# Patient Record
Sex: Female | Born: 1955 | Race: White | Hispanic: No | Marital: Married | Smoking: Former smoker
Health system: Southern US, Community
[De-identification: ages and names within clinical notes are randomized; demographics above are authoritative.]

## PROBLEM LIST (undated history)

## (undated) DIAGNOSIS — E039 Hypothyroidism, unspecified: Secondary | ICD-10-CM

## (undated) DIAGNOSIS — E162 Hypoglycemia, unspecified: Secondary | ICD-10-CM

## (undated) DIAGNOSIS — T4145XA Adverse effect of unspecified anesthetic, initial encounter: Secondary | ICD-10-CM

## (undated) DIAGNOSIS — IMO0001 Reserved for inherently not codable concepts without codable children: Secondary | ICD-10-CM

## (undated) DIAGNOSIS — R87619 Unspecified abnormal cytological findings in specimens from cervix uteri: Secondary | ICD-10-CM

## (undated) DIAGNOSIS — Z5189 Encounter for other specified aftercare: Secondary | ICD-10-CM

## (undated) DIAGNOSIS — R51 Headache: Secondary | ICD-10-CM

## (undated) DIAGNOSIS — M810 Age-related osteoporosis without current pathological fracture: Secondary | ICD-10-CM

## (undated) DIAGNOSIS — M1990 Unspecified osteoarthritis, unspecified site: Secondary | ICD-10-CM

## (undated) DIAGNOSIS — K759 Inflammatory liver disease, unspecified: Secondary | ICD-10-CM

## (undated) DIAGNOSIS — C801 Malignant (primary) neoplasm, unspecified: Secondary | ICD-10-CM

## (undated) DIAGNOSIS — T8859XA Other complications of anesthesia, initial encounter: Secondary | ICD-10-CM

## (undated) HISTORY — DX: Age-related osteoporosis without current pathological fracture: M81.0

## (undated) HISTORY — PX: TONSILLECTOMY: SUR1361

## (undated) HISTORY — DX: Unspecified abnormal cytological findings in specimens from cervix uteri: R87.619

## (undated) HISTORY — PX: JOINT REPLACEMENT: SHX530

## (undated) HISTORY — PX: COLONOSCOPY: SHX174

## (undated) HISTORY — PX: CERVICAL CONE BIOPSY: SUR198

## (undated) HISTORY — PX: EYE SURGERY: SHX253

## (undated) HISTORY — DX: Hypoglycemia, unspecified: E16.2

## (undated) HISTORY — DX: Encounter for other specified aftercare: Z51.89

## (undated) HISTORY — DX: Malignant (primary) neoplasm, unspecified: C80.1

## (undated) HISTORY — PX: OTHER SURGICAL HISTORY: SHX169

---

## 2011-10-15 ENCOUNTER — Encounter (HOSPITAL_COMMUNITY): Payer: Self-pay | Admitting: Pharmacy Technician

## 2011-10-23 ENCOUNTER — Encounter (HOSPITAL_COMMUNITY)
Admission: RE | Admit: 2011-10-23 | Discharge: 2011-10-23 | Disposition: A | Discharge status: Home / Self Care | Payer: Managed Care, Other (non HMO) | Source: Ambulatory Visit | Attending: Orthopedic Surgery | Admitting: Orthopedic Surgery

## 2011-10-23 ENCOUNTER — Encounter (HOSPITAL_COMMUNITY): Payer: Self-pay

## 2011-10-23 HISTORY — DX: Hypothyroidism, unspecified: E03.9

## 2011-10-23 HISTORY — DX: Inflammatory liver disease, unspecified: K75.9

## 2011-10-23 HISTORY — DX: Headache: R51

## 2011-10-23 HISTORY — DX: Adverse effect of unspecified anesthetic, initial encounter: T41.45XA

## 2011-10-23 HISTORY — DX: Unspecified osteoarthritis, unspecified site: M19.90

## 2011-10-23 HISTORY — DX: Other complications of anesthesia, initial encounter: T88.59XA

## 2011-10-23 HISTORY — DX: Encounter for other specified aftercare: Z51.89

## 2011-10-23 HISTORY — DX: Reserved for inherently not codable concepts without codable children: IMO0001

## 2011-10-23 LAB — COMPREHENSIVE METABOLIC PANEL
ALT: 46 U/L — ABNORMAL HIGH (ref 0–35)
Albumin: 4 g/dL (ref 3.5–5.2)
Alkaline Phosphatase: 81 U/L (ref 39–117)
Chloride: 102 mEq/L (ref 96–112)
Potassium: 4.3 mEq/L (ref 3.5–5.1)
Sodium: 139 mEq/L (ref 135–145)
Total Bilirubin: 0.4 mg/dL (ref 0.3–1.2)
Total Protein: 8 g/dL (ref 6.0–8.3)

## 2011-10-23 LAB — CBC
Hemoglobin: 15 g/dL (ref 12.0–15.0)
MCHC: 33.9 g/dL (ref 30.0–36.0)
RDW: 12.7 % (ref 11.5–15.5)
WBC: 6.8 10*3/uL (ref 4.0–10.5)

## 2011-10-23 LAB — PROTIME-INR
INR: 1.02 (ref 0.00–1.49)
Prothrombin Time: 13.6 seconds (ref 11.6–15.2)

## 2011-10-23 MED ORDER — CHLORHEXIDINE GLUCONATE 4 % EX LIQD: 60.0000 mL | Freq: Once | CUTANEOUS | Status: DC

## 2011-10-23 NOTE — H&P (Signed)
[name] is an 56 y.o. female.   Chief Complaint: Pain in left hip.  HPI: Progressive L. Hip pain secondary to OA.  No past medical history on file.  No past surgical history on file.  No family history on file. Social History:  does not have a smoking history on file. She does not have any smokeless tobacco history on file. Her alcohol and drug histories not on file.  Allergies: No Known Allergies  No current facility-administered medications on file as of .   No current outpatient prescriptions on file as of .    No results found for this or any previous visit (from the past 48 hour(s)). No results found.  Review of Systems  Constitutional: Negative.   HENT: Negative.   Eyes: Negative.   Respiratory: Negative.   Cardiovascular: Negative.   Gastrointestinal: Negative.   Genitourinary: Negative.   Musculoskeletal: Positive for joint pain.  Skin: Negative.   Neurological: Negative.   Endo/Heme/Allergies: Negative.   Psychiatric/Behavioral: Negative.     There were no vitals taken for this visit. Physical Exam  Constitutional: She appears well-nourished.  HENT:  Head: Normocephalic.  Eyes: Conjunctivae are normal. Pupils are equal, round, and reactive to light.  Neck: Normal range of motion. Neck supple.  Cardiovascular: Normal rate and regular rhythm.   Respiratory: Effort normal and breath sounds normal.  GI: Soft.  Musculoskeletal: She exhibits tenderness.  Skin: Skin is warm.  Psychiatric: She has a normal mood and affect.     Assessment/Plan Left Total Hip Replacement  [name] III,Kenyon Eshleman L 10/23/2011, 9:03 AM

## 2011-10-23 NOTE — Patient Instructions (Signed)
20 Kayna Suppa  10/23/2011   Your procedure is scheduled on:  10/30/11   Tuesday   Surgery 1230-1530  Report to [facility] at 1000 AM.  Call this number if you have problems the morning of surgery: [phone]  PST Deliah Goody [id number]   Remember:   Do not eat food:After Midnight. Monday night  May have clear liquids:until Midnight . Monday night  Clear liquids include soda, tea, black coffee, apple or grape juice, broth.  Take these medicines the morning of surgery with A SIP OF WATER: Levothyroid with sip water  Do not wear jewelry, make-up or nail polish.  Do not wear lotions, powders, or perfumes. You may wear deodorant.  Do not shave 48 hours prior to surgery.  Do not bring valuables to the hospital.  Contacts, dentures or bridgework may not be worn into surgery.  Leave suitcase in the car. After surgery it may be brought to your room.  For patients admitted to the hospital, checkout time is 11:00 AM the day of discharge.   Patients discharged the day of surgery will not be allowed to drive home.  Name and phone number of your driver: Rehab  Special Instructions: CHG Shower Use Special Wash: 1/2 bottle night before surgery and 1/2 bottle morning of surgery. Regular soap face and privates   Please read over the following fact sheets that you were given: MRSA Information

## 2011-10-24 NOTE — Pre-Procedure Instructions (Signed)
STATES DOES NOT INGEST ANY SUGAR AND HASNT FOR YEARS_ USES STEVIA

## 2011-10-24 NOTE — Pre-Procedure Instructions (Signed)
PST visit 10/23/11- no criteria met for EKG/CHEST X RAY PER ANESTHESIA

## 2011-10-24 NOTE — H&P (Signed)
[name], [name] NO.:  [id number]  MEDICAL RECORD NO.:  [id number]  LOCATION:                               FACILITY:  [facility]  PHYSICIAN:  Marlowe Kays, M.D.  DATE OF BIRTH:  Jan 24, 1956  DATE OF ADMISSION:  10/30/2011 DATE OF DISCHARGE:                             HISTORY & PHYSICAL   Scheduled for admission to [facility] on October 30, 2011.  CHIEF COMPLAINT:  "Pain in my left hip."  HISTORY OF PRESENT ILLNESS:  This 56 year old lady has been seen by [name], continued progressive problems concerning pain into her left hip.  She has developed osteoarthritis into the hip and has locking, catching, and popping into the left hip area with groin pain.  She has had more more difficulty in ambulation.  She is relatively young lady and this is markedly interfering with her day-to-day activities.  After much consideration including the risks and benefits of surgery, she decided to go ahead with total hip replacement, arthroplasty on the left.  The ceramic system will be used.  ALLERGIES:  The patient has some mild allergy to Neosporin and eggs.  CURRENT MEDICATIONS:  Levothyroxine, sodium, probiotic, and multivitamins.  MEDICAL PROBLEMS:  Hypothyroidism, history of hepatitis C, mild hypoglycemia, childhood scarlet, and strep infections.  PAST SURGICAL HISTORY:  No previous surgeries.  SOCIAL HISTORY:  The patient is married.  She is a Dispensing optician, has smokes about 5 cigarettes per day, has no intake of alcohol products.  FAMILY HISTORY:  Unknown as she is adopted.  REVIEW OF SYSTEMS:  CNS:  No seizure disorder, paralysis, numbness, double vision.  RESPIRATORY:  No productive cough.  No hemoptysis.  No shortness of breath.  CARDIOVASCULAR:  No chest pain.  No angina.  No orthopnea.  GASTROINTESTINAL:  No nausea, vomiting, melena, or bloody stool.  GENITOURINARY:  No discharge, dysuria, or hematuria. MUSCULOSKELETAL:  Primarily in  present illness.  PHYSICAL EXAMINATION:  GENERAL:  She is alert, cooperative, friendly, 56- year-old, white female.  She is awake and alert.  She walks QAMARKER] painful limp and gait with a lurch to the left.  She is accompanied by her husband. HEENT:  Normocephalic.  PERRLA.  Oropharynx is clear. CHEST:  Clear to auscultation.  No rhonchi or rales. HEART:  Regular rate and rhythm.  No murmurs are heard. ABDOMEN:  Soft and nontender.  Liver and spleen not felt. GENITALIA/RECTAL/PELVIC/BREASTS:  Not done, not pertinent to present illness. EXTREMITIES:  She has pain with range of motion of the left hip, most particularly on internal and external rotation.  ADMITTING DIAGNOSES: 1. Osteoarthritis of the left hip. 2. Hypothyroidism. 3. History of hepatitis C.  PLAN:  The patient will undergo total hip replacement, arthroplasty with ceramic system to the left hip.  We will decide in the hospital whether she needs any inpatient rehabilitation after regular hospitalization.     Jamion Carter L. Cherlynn June.   ______________________________ Marlowe Kays, M.D.    DLU/MEDQ  D:  10/23/2011  T:  10/24/2011  Job:  [id number]  cc:   Marlowe Kays, M.D. Fax: [phone]

## 2011-10-30 ENCOUNTER — Inpatient Hospital Stay (HOSPITAL_COMMUNITY): Payer: Managed Care, Other (non HMO)

## 2011-10-30 ENCOUNTER — Inpatient Hospital Stay (HOSPITAL_COMMUNITY): Payer: Managed Care, Other (non HMO) | Admitting: Certified Registered Nurse Anesthetist

## 2011-10-30 ENCOUNTER — Encounter (HOSPITAL_COMMUNITY): Payer: Self-pay | Admitting: Certified Registered Nurse Anesthetist

## 2011-10-30 ENCOUNTER — Encounter (HOSPITAL_COMMUNITY)
Admission: RE | Disposition: A | Discharge status: Home Health | Payer: Self-pay | Source: Ambulatory Visit | Attending: Orthopedic Surgery

## 2011-10-30 ENCOUNTER — Encounter (HOSPITAL_COMMUNITY): Payer: Self-pay | Admitting: *Deleted

## 2011-10-30 ENCOUNTER — Inpatient Hospital Stay (HOSPITAL_COMMUNITY)
Admission: RE | Admit: 2011-10-30 | Discharge: 2011-11-03 | DRG: 470 | Disposition: A | Discharge status: Home Health | Payer: Managed Care, Other (non HMO) | Source: Ambulatory Visit | Attending: Orthopedic Surgery | Admitting: Orthopedic Surgery

## 2011-10-30 DIAGNOSIS — M169 Osteoarthritis of hip, unspecified: Principal | ICD-10-CM | POA: Diagnosis present

## 2011-10-30 DIAGNOSIS — D649 Anemia, unspecified: Secondary | ICD-10-CM | POA: Diagnosis not present

## 2011-10-30 DIAGNOSIS — Z96649 Presence of unspecified artificial hip joint: Secondary | ICD-10-CM

## 2011-10-30 DIAGNOSIS — M1610 Unilateral primary osteoarthritis, unspecified hip: Principal | ICD-10-CM | POA: Diagnosis present

## 2011-10-30 DIAGNOSIS — F17200 Nicotine dependence, unspecified, uncomplicated: Secondary | ICD-10-CM | POA: Diagnosis present

## 2011-10-30 DIAGNOSIS — Z8619 Personal history of other infectious and parasitic diseases: Secondary | ICD-10-CM

## 2011-10-30 DIAGNOSIS — E039 Hypothyroidism, unspecified: Secondary | ICD-10-CM | POA: Diagnosis present

## 2011-10-30 DIAGNOSIS — Z01812 Encounter for preprocedural laboratory examination: Secondary | ICD-10-CM

## 2011-10-30 HISTORY — PX: TOTAL HIP ARTHROPLASTY: SHX124

## 2011-10-30 LAB — TYPE AND SCREEN
ABO/RH(D): A POS
Antibody Screen: NEGATIVE

## 2011-10-30 SURGERY — ARTHROPLASTY, HIP, TOTAL,POSTERIOR APPROACH
Anesthesia: General | Site: Hip | Laterality: Left | Wound class: Clean

## 2011-10-30 MED ORDER — CEFAZOLIN SODIUM 1-5 GM-% IV SOLN
1.0000 g | Freq: Four times a day (QID) | INTRAVENOUS | Status: AC
  Administered 2011-10-30 – 2011-10-31 (×3): 1 g via INTRAVENOUS
  Filled 2011-10-30 (×3): qty 50

## 2011-10-30 MED ORDER — HYDROMORPHONE 0.3 MG/ML IV SOLN
INTRAVENOUS | Status: DC
  Administered 2011-10-30: 17:00:00 via INTRAVENOUS
  Administered 2011-10-30: 0.6 mg via INTRAVENOUS
  Administered 2011-10-31: 1.5 mg via INTRAVENOUS
  Administered 2011-10-31: 1 mg via INTRAVENOUS
  Administered 2011-10-31: 1.1 mg via INTRAVENOUS
  Administered 2011-10-31: 0.9 mg via INTRAVENOUS
  Administered 2011-10-31 (×2): 0.6 mg via INTRAVENOUS
  Administered 2011-11-01: 0.3 mg via INTRAVENOUS
  Administered 2011-11-01 (×2): 0.6 mg via INTRAVENOUS

## 2011-10-30 MED ORDER — ONDANSETRON HCL 4 MG/2ML IJ SOLN
INTRAMUSCULAR | Status: DC | PRN
  Administered 2011-10-30: 4 mg via INTRAVENOUS

## 2011-10-30 MED ORDER — SODIUM CHLORIDE 0.9 % IR SOLN
Status: DC | PRN
  Administered 2011-10-30: 1000 mL

## 2011-10-30 MED ORDER — LACTATED RINGERS IV SOLN
INTRAVENOUS | Status: DC
  Administered 2011-10-30 (×2): via INTRAVENOUS
  Administered 2011-10-30: 1000 mL via INTRAVENOUS

## 2011-10-30 MED ORDER — METHOCARBAMOL 100 MG/ML IJ SOLN
500.0000 mg | Freq: Four times a day (QID) | INTRAVENOUS | Status: DC | PRN
  Administered 2011-10-30: 500 mg via INTRAVENOUS
  Filled 2011-10-30: qty 5

## 2011-10-30 MED ORDER — DIPHENHYDRAMINE HCL 50 MG/ML IJ SOLN: 12.5000 mg | Freq: Four times a day (QID) | INTRAMUSCULAR | Status: DC | PRN

## 2011-10-30 MED ORDER — METHOCARBAMOL 500 MG PO TABS
500.0000 mg | ORAL_TABLET | Freq: Four times a day (QID) | ORAL | Status: DC | PRN
  Administered 2011-11-01 – 2011-11-03 (×5): 500 mg via ORAL
  Filled 2011-10-30 (×5): qty 1

## 2011-10-30 MED ORDER — NALOXONE HCL 0.4 MG/ML IJ SOLN: 0.4000 mg | INTRAMUSCULAR | Status: DC | PRN

## 2011-10-30 MED ORDER — MIDAZOLAM HCL 5 MG/5ML IJ SOLN
INTRAMUSCULAR | Status: DC | PRN
  Administered 2011-10-30: 2 mg via INTRAVENOUS

## 2011-10-30 MED ORDER — HYDROMORPHONE HCL PF 1 MG/ML IJ SOLN
INTRAMUSCULAR | Status: DC | PRN
  Administered 2011-10-30 (×2): 1 mg via INTRAVENOUS
  Administered 2011-10-30: .4 mg via INTRAVENOUS

## 2011-10-30 MED ORDER — CISATRACURIUM BESYLATE 2 MG/ML IV SOLN
INTRAVENOUS | Status: DC | PRN
  Administered 2011-10-30: 6 mg via INTRAVENOUS
  Administered 2011-10-30: 5 mg via INTRAVENOUS

## 2011-10-30 MED ORDER — FENTANYL CITRATE 0.05 MG/ML IJ SOLN
INTRAMUSCULAR | Status: DC | PRN
  Administered 2011-10-30 (×5): 50 ug via INTRAVENOUS

## 2011-10-30 MED ORDER — MEPERIDINE HCL 50 MG/ML IJ SOLN: 6.2500 mg | INTRAMUSCULAR | Status: DC | PRN

## 2011-10-30 MED ORDER — ACETAMINOPHEN 650 MG RE SUPP: 650.0000 mg | Freq: Four times a day (QID) | RECTAL | Status: DC | PRN

## 2011-10-30 MED ORDER — DEXTROSE-NACL 5-0.45 % IV SOLN
INTRAVENOUS | Status: DC
  Administered 2011-10-30 – 2011-10-31 (×3): via INTRAVENOUS

## 2011-10-30 MED ORDER — DOCUSATE SODIUM 100 MG PO CAPS
100.0000 mg | ORAL_CAPSULE | Freq: Two times a day (BID) | ORAL | Status: DC
  Administered 2011-10-31 – 2011-11-03 (×6): 100 mg via ORAL
  Filled 2011-10-30 (×9): qty 1

## 2011-10-30 MED ORDER — RIVAROXABAN 10 MG PO TABS
10.0000 mg | ORAL_TABLET | Freq: Every day | ORAL | Status: DC
  Administered 2011-10-31 – 2011-11-03 (×4): 10 mg via ORAL
  Filled 2011-10-30 (×4): qty 1

## 2011-10-30 MED ORDER — POVIDONE-IODINE 7.5 % EX SOLN: Freq: Once | CUTANEOUS | Status: DC

## 2011-10-30 MED ORDER — ACETAMINOPHEN 325 MG PO TABS
650.0000 mg | ORAL_TABLET | Freq: Four times a day (QID) | ORAL | Status: DC | PRN
  Filled 2011-10-30 (×2): qty 2

## 2011-10-30 MED ORDER — SODIUM CHLORIDE 0.9 % IV SOLN: INTRAVENOUS | Status: DC

## 2011-10-30 MED ORDER — MENTHOL 3 MG MT LOZG: 1.0000 | LOZENGE | OROMUCOSAL | Status: DC | PRN

## 2011-10-30 MED ORDER — DIPHENHYDRAMINE HCL 12.5 MG/5ML PO ELIX: 12.5000 mg | ORAL_SOLUTION | Freq: Four times a day (QID) | ORAL | Status: DC | PRN

## 2011-10-30 MED ORDER — LEVOTHYROXINE SODIUM 75 MCG PO TABS
37.5000 ug | ORAL_TABLET | Freq: Every day | ORAL | Status: DC
  Administered 2011-10-31: 08:00:00 via ORAL
  Administered 2011-11-01 – 2011-11-03 (×3): 37.5 ug via ORAL
  Filled 2011-10-30 (×4): qty 0.5

## 2011-10-30 MED ORDER — PHENOL 1.4 % MT LIQD: 1.0000 | OROMUCOSAL | Status: DC | PRN

## 2011-10-30 MED ORDER — SUCCINYLCHOLINE CHLORIDE 20 MG/ML IJ SOLN
INTRAMUSCULAR | Status: DC | PRN
  Administered 2011-10-30: 100 mg via INTRAVENOUS

## 2011-10-30 MED ORDER — CEFAZOLIN SODIUM-DEXTROSE 2-3 GM-% IV SOLR
2.0000 g | INTRAVENOUS | Status: AC
  Administered 2011-10-30: 1 g via INTRAVENOUS

## 2011-10-30 MED ORDER — HYDROMORPHONE HCL PF 1 MG/ML IJ SOLN
0.2500 mg | INTRAMUSCULAR | Status: DC | PRN
  Administered 2011-10-30 (×4): 0.25 mg via INTRAVENOUS

## 2011-10-30 MED ORDER — PROMETHAZINE HCL 25 MG/ML IJ SOLN: 6.2500 mg | INTRAMUSCULAR | Status: DC | PRN

## 2011-10-30 MED ORDER — LIDOCAINE HCL (CARDIAC) 20 MG/ML IV SOLN
INTRAVENOUS | Status: DC | PRN
  Administered 2011-10-30: 60 mg via INTRAVENOUS

## 2011-10-30 MED ORDER — ONDANSETRON HCL 4 MG/2ML IJ SOLN: 4.0000 mg | Freq: Four times a day (QID) | INTRAMUSCULAR | Status: DC | PRN

## 2011-10-30 MED ORDER — PROPOFOL 10 MG/ML IV BOLUS
INTRAVENOUS | Status: DC | PRN
  Administered 2011-10-30: 200 mg via INTRAVENOUS

## 2011-10-30 MED ORDER — SODIUM CHLORIDE 0.9 % IJ SOLN: 9.0000 mL | INTRAMUSCULAR | Status: DC | PRN

## 2011-10-30 MED ORDER — NEOSTIGMINE METHYLSULFATE 1 MG/ML IJ SOLN
INTRAMUSCULAR | Status: DC | PRN
  Administered 2011-10-30: 4 mg via INTRAVENOUS

## 2011-10-30 MED ORDER — LACTATED RINGERS IV SOLN: INTRAVENOUS | Status: DC

## 2011-10-30 MED ORDER — CHLORHEXIDINE GLUCONATE 4 % EX LIQD: 60.0000 mL | Freq: Once | CUTANEOUS | Status: DC

## 2011-10-30 MED ORDER — GLYCOPYRROLATE 0.2 MG/ML IJ SOLN
INTRAMUSCULAR | Status: DC | PRN
  Administered 2011-10-30: .6 mg via INTRAVENOUS

## 2011-10-30 SURGICAL SUPPLY — 39 items
BAG ZIPLOCK 12X15 (MISCELLANEOUS) ×4 IMPLANT
BIT DRILL 2.4X128 (BIT) ×2 IMPLANT
BLADE SAW SAG 73X25 THK (BLADE) ×1
BLADE SAW SGTL 73X25 THK (BLADE) ×1 IMPLANT
CLOTH BEACON ORANGE TIMEOUT ST (SAFETY) ×2 IMPLANT
DRAPE INCISE IOBAN 66X45 STRL (DRAPES) ×2 IMPLANT
DRAPE ORTHO SPLIT 77X108 STRL (DRAPES) ×2
DRAPE POUCH INSTRU U-SHP 10X18 (DRAPES) ×2 IMPLANT
DRAPE SURG ORHT 6 SPLT 77X108 (DRAPES) ×2 IMPLANT
DRAPE U-SHAPE 47X51 STRL (DRAPES) ×2 IMPLANT
DRSG ADAPTIC 3X8 NADH LF (GAUZE/BANDAGES/DRESSINGS) ×2 IMPLANT
DRSG PAD ABDOMINAL 8X10 ST (GAUZE/BANDAGES/DRESSINGS) ×2 IMPLANT
DURAPREP 26ML APPLICATOR (WOUND CARE) ×2 IMPLANT
ELECT BLADE TIP CTD 4 INCH (ELECTRODE) ×2 IMPLANT
ELECT REM PT RETURN 9FT ADLT (ELECTROSURGICAL)
ELECTRODE REM PT RTRN 9FT ADLT (ELECTROSURGICAL) IMPLANT
EVACUATOR 1/8 PVC DRAIN (DRAIN) IMPLANT
FACESHIELD LNG OPTICON STERILE (SAFETY) ×8 IMPLANT
GAUZE SPONGE 4X4 12PLY STRL LF (GAUZE/BANDAGES/DRESSINGS) ×2 IMPLANT
GLOVE BIO SURGEON STRL SZ8 (GLOVE) ×2 IMPLANT
GLOVE ECLIPSE 8.0 STRL XLNG CF (GLOVE) ×4 IMPLANT
GLOVE INDICATOR 8.0 STRL GRN (GLOVE) ×4 IMPLANT
GOWN STRL REIN XL XLG (GOWN DISPOSABLE) ×4 IMPLANT
KIT BASIN OR (CUSTOM PROCEDURE TRAY) ×2 IMPLANT
MANIFOLD NEPTUNE II (INSTRUMENTS) ×2 IMPLANT
NS IRRIG 1000ML POUR BTL (IV SOLUTION) ×2 IMPLANT
PACK TOTAL JOINT (CUSTOM PROCEDURE TRAY) ×2 IMPLANT
PILLOW ABDUCTION HIP (SOFTGOODS) ×2 IMPLANT
POSITIONER SURGICAL ARM (MISCELLANEOUS) ×2 IMPLANT
SPONGE LAP 18X18 X RAY DECT (DISPOSABLE) ×2 IMPLANT
STAPLER VISISTAT 35W (STAPLE) ×2 IMPLANT
SUCTION FRAZIER TIP 10 FR DISP (SUCTIONS) ×2 IMPLANT
SUT VIC AB 1 CT1 27 (SUTURE) ×4
SUT VIC AB 1 CT1 27XBRD ANTBC (SUTURE) ×4 IMPLANT
SUT VIC AB 2-0 CT1 27 (SUTURE) ×2
SUT VIC AB 2-0 CT1 27XBRD (SUTURE) ×2 IMPLANT
TAPE CLOTH SURG 6X10 WHT LF (GAUZE/BANDAGES/DRESSINGS) ×2 IMPLANT
TRAY FOLEY CATH 14FRSI W/METER (CATHETERS) ×2 IMPLANT
WATER STERILE IRR 1500ML POUR (IV SOLUTION) ×2 IMPLANT

## 2011-10-30 NOTE — Anesthesia Postprocedure Evaluation (Signed)
  Anesthesia Post-op Note  Patient: [name]  Procedure(s) Performed:  TOTAL HIP ARTHROPLASTY  Patient Location: PACU  Anesthesia Type: General  Level of Consciousness: awake and alert   Airway and Oxygen Therapy: Patient Spontanous Breathing  Post-op Pain: mild  Post-op Assessment: Post-op Vital signs reviewed, Patient's Cardiovascular Status Stable, Respiratory Function Stable, Patent Airway and No signs of Nausea or vomiting  Post-op Vital Signs: stable  Complications: No apparent anesthesia complications

## 2011-10-30 NOTE — Transfer of Care (Signed)
Immediate Anesthesia Transfer of Care Note  Patient: [name]  Procedure(s) Performed:  TOTAL HIP ARTHROPLASTY  Patient Location: PACU  Anesthesia Type: General  Level of Consciousness: awake, alert  and oriented  Airway & Oxygen Therapy: Patient Spontanous Breathing and Patient connected to face mask oxygen  Post-op Assessment: Report given to PACU RN and Post -op Vital signs reviewed and stable  Post vital signs: Reviewed and stable  Complications: No apparent anesthesia complications

## 2011-10-30 NOTE — Interval H&P Note (Signed)
History and Physical Interval Note:  10/30/2011 1:26 PM  [name]  has presented today for surgery, with the diagnosis of Osteoarthritis of the Left hip  The various methods of treatment have been discussed with the patient and family. After consideration of risks, benefits and other options for treatment, the patient has consented to  Procedure(s): TOTAL HIP ARTHROPLASTY as a surgical intervention .  The patients' history has been reviewed, patient examined, no change in status, stable for surgery.  I have reviewed the patients' chart and labs.  Questions were answered to the patient's satisfaction.     APLINGTON,JAMES P

## 2011-10-30 NOTE — Anesthesia Procedure Notes (Signed)
Procedure Name: Intubation Date/Time: 10/30/2011 1:39 PM Performed by: Hulan Fess Pre-anesthesia Checklist: Patient identified, Emergency Drugs available, Suction available, Patient being monitored and Timeout performed Patient Re-evaluated:Patient Re-evaluated prior to inductionOxygen Delivery Method: Circle System Utilized Preoxygenation: Pre-oxygenation with 100% oxygen Intubation Type: IV induction Ventilation: Mask ventilation without difficulty Grade View: Grade I Tube type: Oral Tube size: 8.0 mm Number of attempts: 1 Secured at: 22 cm Tube secured with: Tape Dental Injury: Teeth and Oropharynx as per pre-operative assessment

## 2011-10-30 NOTE — Brief Op Note (Signed)
10/30/2011  4:03 PM  PATIENT:  [name]  56 y.o. female  PRE-OPERATIVE DIAGNOSIS:  Osteoarthritis of the Left hip  POST-OPERATIVE DIAGNOSIS:  Osteoarthritis of the Left hip  PROCEDURE:  Procedure(s): TOTAL HIP ARTHROPLASTY,left  SURGEON:  Surgeon(s): Drucilla Schmidt, MD Jacki Cones, MD  PHYSICIAN ASSISTANT:   ASSISTANTS: nurse  ANESTHESIA:   general  EBL:  Total I/O In: 2400 [I.V.:2400] Out: 975 [Urine:225; Blood:750]  BLOOD ADMINISTERED:none  DRAINS: none   LOCAL MEDICATIONS USED:  NONE  SPECIMEN:  No Specimen  DISPOSITION OF SPECIMEN:  N/A  COUNTS:  YES  TOURNIQUET:  * No tourniquets in log *  DICTATION: Reubin Milan Dictation    [phone]  PLAN OF CARE: Admit to inpatient   PATIENT DISPOSITION:  PACU - hemodynamically stable.

## 2011-10-30 NOTE — Anesthesia Preprocedure Evaluation (Signed)
Anesthesia Evaluation  Patient identified by MRN, date of birth, ID band Patient awake    Reviewed: Allergy & Precautions, H&P , NPO status , Patient's Chart, lab work & pertinent test results  History of Anesthesia Complications Negative for: history of anesthetic complications  Airway Mallampati: II TM Distance: >3 FB Neck ROM: Full    Dental No notable dental hx.    Pulmonary neg pulmonary ROS, Current Smoker,  clear to auscultation  Pulmonary exam normal       Cardiovascular neg cardio ROS Regular Normal    Neuro/Psych  Headaches, Negative Neurological ROS  Negative Psych ROS   GI/Hepatic negative GI ROS, Neg liver ROS, (+) Hepatitis -, C  Endo/Other  Negative Endocrine ROSHypothyroidism   Renal/GU negative Renal ROS  Genitourinary negative   Musculoskeletal negative musculoskeletal ROS (+)   Abdominal   Peds negative pediatric ROS (+)  Hematology negative hematology ROS (+)   Anesthesia Other Findings Front upper veneers   Reproductive/Obstetrics negative OB ROS                           Anesthesia Physical Anesthesia Plan  ASA: II  Anesthesia Plan: General   Post-op Pain Management:    Induction: Intravenous  Airway Management Planned: Oral ETT  Additional Equipment:   Intra-op Plan:   Post-operative Plan: Extubation in OR  Informed Consent: I have reviewed the patients History and Physical, chart, labs and discussed the procedure including the risks, benefits and alternatives for the proposed anesthesia with the patient or authorized representative who has indicated his/her understanding and acceptance.   Dental advisory given  Plan Discussed with: CRNA  Anesthesia Plan Comments:         Anesthesia Quick Evaluation

## 2011-10-31 LAB — CBC
MCH: 31.1 pg (ref 26.0–34.0)
MCV: 90.8 fL (ref 78.0–100.0)
Platelets: 255 10*3/uL (ref 150–400)
RBC: 3.57 MIL/uL — ABNORMAL LOW (ref 3.87–5.11)

## 2011-10-31 MED ORDER — HYDROMORPHONE 0.3 MG/ML IV SOLN
INTRAVENOUS | Status: AC
  Filled 2011-10-31: qty 25

## 2011-10-31 NOTE — Op Note (Signed)
NAMEJENSYN, SHAVE          ACCOUNT NO.:  [id number]  MEDICAL RECORD NO.:  [id number]  LOCATION:  1604                         FACILITY:  [facility]  PHYSICIAN:  Marlowe Kays, M.D.  DATE OF BIRTH:  1956/02/01  DATE OF PROCEDURE:  10/30/2011 DATE OF DISCHARGE:                              OPERATIVE REPORT   PREOPERATIVE DIAGNOSIS:  Osteoarthritis, left hip.  POSTOPERATIVE DIAGNOSIS:  Osteoarthritis, left hip.  OPERATION:  Osteonics total hip replacement, left.  SURGEON:  Marlowe Kays, M.D.  ASSISTANT:  Georges Lynch. Darrelyn Hillock, M.D.  ANESTHESIA:  General.  PATHOLOGY AND JUSTIFICATION FOR PROCEDURE:  She had a completely worn out left hip with significant pain.  There was bone-on-bone abutment between the femoral head and acetabulum.  Risks and complications were discussed with her.  She desired to have a ceramic hip.  PROCEDURE IN DETAIL:  Prophylactic antibiotics, satisfied general anesthesia, Foley catheter inserted, right lateral decubitus position, marked to frame, left hip was prepped with DuraPrep, draped in sterile field, IV employed, time-out performed, posterolateral incision down to the fascia lata.  External rotators were detached from the femur and Zickel's band__________ was cut.  Hip capsule was identified and opened with a partial capsulectomy performed.  The hip was dislocated and I made my first femoral neck cut, right below the femoral head.  Piriformis fossa was cleared of soft tissue and canal finder was then used to place down the femoral canal followed by vertical reamers up to a size 8.  I along the way made my final cut of the femoral neck.  I think fingerbreadths above the lesser trochanter.  I also used a greater trochanteric reamer. I then began the rasping process up to an 8, which seemed to be a nice fit.  Dressing the acetabulum, total capsulectomy was performed and then reaming begun, I used the deepening reamer 44 followed by  46 to eliminate  the fossa ovale and get down to good bleeding bone.  I checked acetabular bone bed at this point with a small drill and it was satisfactory and began expanding reamers working up to a 56 with good bone contact and good bleeding bone.  I then made a placed the trial 56 component, which was nice and snug and appeared to be in good position. Accordingly, I followed this with a 56 metal-backed acetabular shell, which we impacted completely and was nice and stable.  I then placed 2 screws one superior and one slightly posterior, which were individually measured and remained in bone with 25 mm screw.  Acetabulum was then felt to be nice and stable and returned to the femur where it was cleared of blood and the femoral component noncemented was placed, size 8, 127-degree angle.  This was impacted gently, but firmly and we then went about checking neck lengths and.  The question was between +5 and +10 neck length.  The +10 appeared to give too long a femur, checking knee length, and foot length, and consequently we used a size +5, which was very stable in flexion and rotation.  The appropriate +5 ceramic head was then impacted along with a 46 polyethylene shell and the hip was reduced and found to be nice and stable.  The wound was once again irrigated as we had throughout the case and closure performed.  External rotators and Zickel's band__________ were reconstituted with interrupted #1 Vicryl, fascia lata with the same, subcutaneous tissue with combination of #1 and 2-0 Vicryl, staples in the skin.  Betadine, Adaptic, dry sterile dressing were applied.  She was gently moved over to the PACU bed and placed in a abduction pillow.  She tolerated the procedure well.  Estimated blood loss was 750 cc with no blood replacement.          ______________________________ Marlowe Kays, M.D.     JA/MEDQ  D:  10/30/2011  T:  10/31/2011  Job:  [id number]

## 2011-10-31 NOTE — Evaluation (Addendum)
Physical Therapy Evaluation Patient Details Name: [name] MRN: [id number] DOB: Jul 06, 1956 Today's Date: 10/31/2011  Problem List: There is no problem list on file for this patient.  S/p posterior LEFT THA---50%PWB  Past Medical History:  Past Medical History  Diagnosis Date  . Complication of anesthesia     sensitive to all meds- takes longer to metabolize  . Hypothyroidism   . Blood transfusion     1968  . Arthritis   . 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 Hx of BLACK WIDOW SPIDER BITE x 2 left shoulder 10/11 with  "knot at base of scull"   seen by PCP- no neuro visit- resolved  . Hepatitis     ? year- HEPATITIS C   Past Surgical History:  Past Surgical History  Procedure Date  . Tonsillectomy   . Colonoscopy   . Eye surgery     Lasic- left eye    PT Assessment/Plan/Recommendation PT Assessment Clinical Impression Statement: pt will benefit from PT to maximize independence for next venue PT Recommendation/Assessment: Patient will need skilled PT in the acute care venue PT Problem List: Decreased strength;Decreased range of motion;Decreased activity tolerance;Decreased knowledge of use of DME;Decreased knowledge of precautions;Pain Barriers to Discharge: Decreased caregiver support (husband works days) PT Therapy Diagnosis : Difficulty walking PT Plan PT Frequency: 7X/week PT Treatment/Interventions: DME instruction;Gait training;Functional mobility training;Therapeutic activities;Therapeutic exercise;Patient/family education PT Recommendation Follow Up Recommendations: Home health PT;Skilled nursing facility (depending on progress) Equipment Recommended:  (TBA) PT Goals  Acute Rehab PT Goals PT Goal Formulation: With patient Time For Goal Achievement: 7 days Pt will go Supine/Side to Sit: with min assist PT Goal: Supine/Side to Sit - Progress: Goal set today Pt will go Sit to Supine/Side: with min assist PT Goal: Sit to Supine/Side -  Progress: Goal set today Pt will go Sit to Stand: with supervision PT Goal: Sit to Stand - Progress: Goal set today Pt will go Stand to Sit: with supervision PT Goal: Stand to Sit - Progress: Goal set today Pt will Ambulate: 51 - 150 feet;with supervision;with rolling walker PT Goal: Ambulate - Progress: Goal set today Pt will Go Up / Down Stairs: 6-9 stairs;with min assist;with least restrictive assistive device PT Goal: Up/Down Stairs - Progress: Goal set today Pt will Perform Home Exercise Program: with supervision, verbal cues required/provided PT Goal: Perform Home Exercise Program - Progress: Goal set today  PT Evaluation Precautions/Restrictions  Precautions Precautions: Posterior Hip Precaution Booklet Issued:  (sign in room) Prior Functioning  Home Living Lives With: Spouse Receives Help From:  (husband) Type of Home: House Home Layout: Two level;Able to live on main level with bedroom/bathroom Home Access: Stairs to enter Entrance Stairs-Rails: None Entrance Stairs-Number of Steps: 6 Prior Function Level of Independence: Independent with basic ADLs;Independent with gait Able to Take Stairs?: Yes Cognition   Sensation/Coordination   Extremity Assessment RUE Assessment RUE Assessment: Within Functional Limits LUE Assessment LUE Assessment: Within Functional Limits RLE Assessment RLE Assessment: Within Functional Limits LLE Assessment LLE Assessment:  (ankle WFL, AAROM hip/knee WFL, painful, > 2+/5) Mobility (including Balance) Bed Mobility Bed Mobility: Yes Supine to Sit: 3: Mod assist Supine to Sit Details (indicate cue type and reason): cues for technique Sitting - Scoot to Edge of Bed: 4: Min assist Sitting - Scoot to [place] of Bed Details (indicate cue type and reason): min assist with LLE Transfers Transfers: Yes Sit to Stand: 4: Min assist;3: Mod assist;With upper extremity assist;From bed;From  chair/3-in-1 Sit to Stand Details (indicate cue type and  reason): cues for THP Stand to Sit: 4: Min assist;3: Mod assist;To chair/3-in-1 Stand to Sit Details: cues for THP Ambulation/Gait Ambulation/Gait: Yes Ambulation/Gait Assistance: 4: Min assist;3: Mod assist Ambulation/Gait Assistance Details (indicate cue type and reason): cues for sequence, RW placement Ambulation Distance (Feet): 15 Feet (x2) Assistive device: Rolling walker Gait Pattern: Step-to pattern;Antalgic Gait velocity: slow    Exercise  Total Joint Exercises Ankle Circles/Pumps: AROM;Both;10 reps End of Session PT - End of Session Activity Tolerance: Patient limited by pain Patient left: in chair Nurse Communication: Mobility status for transfers;Mobility status for ambulation General Behavior During Session: [facility] for tasks performed Cognition: [facility] for tasks performed  [facility] 10/31/2011, 9:48 AM

## 2011-10-31 NOTE — Progress Notes (Signed)
Patient ID: [name], female   DOB: 06/01/1956, 56 y.o.   MRN: [id number] PO day 1--comfortable on PCA.  Post op xray looks good.  Hgb 11.1.  NV intact.

## 2011-10-31 NOTE — Progress Notes (Signed)
FL2 in shadow chart for MD signature. CSW assisting with d/c planning (  HHPT vs ST SNF ). Pt is interested in [facility] and [facility] ( [place] ) if SNF is required. SNF's contacted to check availability and if they are in network with insurance provider. Will continue to follow to assist with d/c planning.

## 2011-10-31 NOTE — Progress Notes (Signed)
Physical Therapy Treatment Patient Details Name: [name] MRN: [id number] DOB: 12/02/1955 Today's Date: 10/31/2011  PT Assessment/Plan  PT - Assessment/Plan Comments on Treatment Session: pt fatigued thsi pm, should progress well, motivated to go home vs SNF at this tiem PT Plan: Discharge plan remains appropriate;Frequency remains appropriate PT Frequency: 7X/week Follow Up Recommendations: Home health PT;Skilled nursing facility (likely home) Equipment Recommended: Rolling walker with 5" wheels (?) PT Goals  Acute Rehab PT Goals  Time For Goal Achievement: 7 days  Pt will go Sit to Supine/Side: with min assist PT Goal: Sit to Supine/Side - Progress: Progressing toward goal  Pt will go Sit to Stand: with supervision PT Goal: Sit to Stand - Progress: Progressing toward goal  Pt will go Stand to Sit: with supervision PT Goal: Stand to Sit - Progress: Progressing toward goal  Pt will Ambulate: 51 - 150 feet;with supervision;with rolling walker PT Goal: Ambulate - Progress: Progressing toward goal  Pt will Perform Home Exercise Program: with supervision, verbal cues required/provided PT Goal: Perform Home Exercise Program - Progress: Progressing toward goal Additional Goals Additional Goal #1: pt will recall 3/3 THP and PWB with min cues  PT Goal: Additional Goal #1 - Progress: Goal set today  PT Treatment Precautions/Restrictions  Precautions Precautions: Posterior Hip Precaution Booklet Issued:  (sign in room) Precaution Comments: sign in room Restrictions Weight Bearing Restrictions: Yes LLE Weight Bearing: Partial weight bearing LLE Partial Weight Bearing Percentage or Pounds: 50% Mobility (including Balance) Bed Mobility Bed Mobility: Yes Sit to Supine: 4: Min assist;HOB flat Sit to Supine - Details (indicate cue type and reason): min with LLE; verbal cues for technique Transfers Transfers: Yes Sit to Stand: 4: Min assist;With upper extremity assist;From  chair/3-in-1 Sit to Stand Details (indicate cue type and reason): cues for THP Stand to Sit: 4: Min assist;With upper extremity assist;To bed Stand to Sit Details: verbal cues for hands and THP Ambulation/Gait Ambulation/Gait: Yes Ambulation/Gait Assistance: 4: Min assist Ambulation/Gait Assistance Details (indicate cue type and reason): pivotal steps chair to bed, cues for sequence and PWB Ambulation Distance (Feet): 6 Feet Assistive device: Rolling walker Gait Pattern: Step-to pattern;Antalgic Gait velocity: slow    Exercise  Total Joint Exercises Ankle Circles/Pumps: AROM;Both;10 reps Quad Sets: Left;AROM;10 reps Heel Slides: AAROM;Left;10 reps Hip ABduction/ADduction: Left;AAROM;10 reps End of Session PT - End of Session Activity Tolerance: Patient limited by fatigue Patient left: in bed;with call bell in reach Nurse Communication: Mobility status for transfers;Mobility status for ambulation General Behavior During Session: [facility] for tasks performed Cognition: [facility] for tasks performed  [facility] 10/31/2011, 11:25 AM

## 2011-11-01 LAB — CBC
HCT: 29.6 % — ABNORMAL LOW (ref 36.0–46.0)
MCHC: 33.4 g/dL (ref 30.0–36.0)
MCV: 90.8 fL (ref 78.0–100.0)
RDW: 12.6 % (ref 11.5–15.5)

## 2011-11-01 MED ORDER — OXYCODONE HCL 5 MG PO TABS
5.0000 mg | ORAL_TABLET | ORAL | Status: DC | PRN
  Administered 2011-11-01 – 2011-11-03 (×10): 10 mg via ORAL
  Filled 2011-11-01 (×10): qty 2

## 2011-11-01 NOTE — Progress Notes (Signed)
Physical Therapy Treatment Patient Details Name: [name] MRN: [id number] DOB: July 18, 1956 Today's Date: 11/01/2011  PT Assessment/Plan  PT - Assessment/Plan Comments on Treatment Session: progressing well PT Plan: Discharge plan remains appropriate;Frequency remains appropriate PT Frequency: 7X/week Follow Up Recommendations: Home health PT;Skilled nursing facility (pt desires to D/C home, should progress to this) Equipment Recommended: Rolling walker with 5" wheels PT Goals  Acute Rehab PT Goals Pt will go Supine/Side to Sit: with min assist PT Goal: Supine/Side to Sit - Progress: Progressing toward goal Pt will go Sit to Stand: with supervision PT Goal: Sit to Stand - Progress: Progressing toward goal Pt will go Stand to Sit: with supervision PT Goal: Stand to Sit - Progress: Progressing toward goal Pt will Ambulate: 51 - 150 feet;with supervision;with rolling walker PT Goal: Ambulate - Progress: Progressing toward goal Pt will Perform Home Exercise Program: with supervision, verbal cues required/provided PT Goal: Perform Home Exercise Program - Progress: Progressing toward goal Additional Goals Additional Goal #1: pt will recall 3/3 THP and PWB with min cues  PT Goal: Additional Goal #1 - Progress: Progressing toward goal  PT Treatment Precautions/Restrictions  Precautions Precautions: Posterior Hip Precaution Booklet Issued:  (sign in room) Precaution Comments: sign in room Restrictions Weight Bearing Restrictions: Yes LLE Weight Bearing: Partial weight bearing LLE Partial Weight Bearing Percentage or Pounds: 50% Mobility (including Balance) Bed Mobility Supine to Sit: 4: Min assist Supine to Sit Details (indicate cue type and reason): cues for THP and min assist with LLE Sitting - Scoot to Edge of Bed: 5: Supervision Transfers Sit to Stand: 4: Min assist;With upper extremity assist;From bed Sit to Stand Details (indicate cue type and reason): cues for  THP Stand to Sit: 4: Min assist;5: Supervision;With armrests;With upper extremity assist;To chair/3-in-1 Stand to Sit Details: verbal cues for hands and THP Ambulation/Gait Ambulation/Gait Assistance: 4: Min assist;5: Supervision Ambulation/Gait Assistance Details (indicate cue type and reason): cues PWB and sequence and RW dist from self Ambulation Distance (Feet): 80 Feet Assistive device: Rolling walker Gait Pattern: Step-to pattern    Exercise  Total Joint Exercises Ankle Circles/Pumps: AROM;Both;10 reps Quad Sets: Left;AROM;10 reps Short Arc QuadBarbaraann Jackson;Left;10 reps Heel Slides: AAROM;Left;10 reps Hip ABduction/ADduction: Left;AAROM;10 reps End of Session PT - End of Session Equipment Utilized During Treatment: Gait belt Activity Tolerance: Patient tolerated treatment well Patient left: with call bell in reach;in chair Nurse Communication: Mobility status for transfers;Mobility status for ambulation General Behavior During Session: [facility] for tasks performed Cognition: [facility] for tasks performed  [facility] 11/01/2011, 10:39 AM

## 2011-11-01 NOTE — Evaluation (Signed)
Occupational Therapy Evaluation Patient Details Name: [name] MRN: [id number] DOB: [birth year] Today's Date: 11/01/2011  Problem List: There is no problem list on file for this patient.   Past Medical History:  Past Medical History  Diagnosis Date  . Complication of anesthesia     sensitive to all meds- takes longer to metabolize  . Hypothyroidism   . Blood transfusion     1968  . Arthritis   . 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 Hx of BLACK WIDOW SPIDER BITE x 2 left shoulder 10/11 with  "knot at base of scull"   seen by PCP- no neuro visit- resolved  . Hepatitis     ? year- HEPATITIS C   Past Surgical History:  Past Surgical History  Procedure Date  . Tonsillectomy   . Colonoscopy   . Eye surgery     Lasic- left eye    OT Assessment/Plan/Recommendation OT Assessment Clinical Impression Statement: Pt will benefit from skilled OT services to increase her independence with ADL prior to discharge home. OT Recommendation/Assessment: Patient will need skilled OT in the acute care venue OT Problem List: Decreased strength;Decreased knowledge of use of DME or AE;Pain;Decreased knowledge of precautions OT Therapy Diagnosis : Generalized weakness;Acute pain OT Plan OT Frequency: Min 2X/week OT Treatment/Interventions: Self-care/ADL training;Therapeutic activities;DME and/or AE instruction;Patient/family education OT Recommendation Follow Up Recommendations: Home health OT Equipment Recommended: Rolling walker with 5" wheels;3 in 1 bedside comode;Tub/shower bench Individuals Consulted Consulted and Agree with Results and Recommendations: Family member/caregiver;Patient Family Member Consulted: spouse OT Goals Acute Rehab OT Goals OT Goal Formulation: With patient Time For Goal Achievement: 7 days ADL Goals Pt Will Perform Grooming: with supervision;Standing at sink ADL Goal: Grooming - Progress: Goal set today Pt Will Perform Lower Body  Bathing: with supervision;Sit to stand from chair;Sit to stand from bed;with adaptive equipment ADL Goal: Lower Body Bathing - Progress: Goal set today Pt Will Perform Lower Body Dressing: with supervision;Sit to stand from bed;Sit to stand from chair;with adaptive equipment ADL Goal: Lower Body Dressing - Progress: Goal set today Pt Will Transfer to Toilet: with supervision;with DME;Ambulation;3-in-1 ADL Goal: Toilet Transfer - Progress: Goal set today Pt Will Perform Toileting - Clothing Manipulation: with supervision;Standing ADL Goal: Toileting - Clothing Manipulation - Progress: Goal set today Pt Will Perform Toileting - Hygiene: with supervision;Standing at 3-in-1/toilet ADL Goal: Toileting - Hygiene - Progress: Goal set today Pt Will Perform Tub/Shower Transfer: Tub transfer;with min assist;Transfer tub bench ADL Goal: Tub/Shower Transfer - Progress: Goal set today  OT Evaluation Precautions/Restrictions  Precautions Precautions: Posterior Hip Precaution Booklet Issued:  (sign in room) Precaution Comments: sign in room Restrictions Weight Bearing Restrictions: Yes LLE Weight Bearing: Partial weight bearing LLE Partial Weight Bearing Percentage or Pounds: 50% Prior Functioning Home Living Lives With: Spouse Receives Help From:  (husband) Type of Home: House Home Layout: Two level;Able to live on main level with bedroom/bathroom Home Access: Stairs to enter Entrance Stairs-Rails: None Entrance Stairs-Number of Steps: 6 Bathroom Shower/Tub: Engineer, manufacturing systems: Standard Home Adaptive Equipment: None Prior Function Level of Independence: Independent with basic ADLs;Independent with gait;Independent with homemaking with ambulation Comments: spouse works. Pt does not have assist at discharge. ADL ADL Eating/Feeding: Simulated;Independent Where Assessed - Eating/Feeding: Chair Grooming: Performed;Minimal assistance Grooming Details (indicate cue type and  reason): min guard assist min verbal cues for not twisting on L LE Where Assessed - Grooming: Standing at sink Upper Body Bathing: Simulated;Chest;Right arm;Left arm;Abdomen;Set up  Where Assessed - Upper Body Bathing: Sitting, chair Lower Body Bathing: Simulated;Moderate assistance Lower Body Bathing Details (indicate cue type and reason): without adaptive equipment Where Assessed - Lower Body Bathing: Sit to stand from chair Upper Body Dressing: Simulated;Set up Where Assessed - Upper Body Dressing: Sitting, chair;Unsupported Lower Body Dressing: Simulated;Maximal assistance Lower Body Dressing Details (indicate cue type and reason): without adaptive equipment. Practiced with reacher to doff left sock with supervision and min verbal cues. Donned with sock aid with supervision and min verbal cues. Where Assessed - Lower Body Dressing: Sit to stand from chair Toilet Transfer: Performed;Minimal assistance Toilet Transfer Details (indicate cue type and reason): min verbal cues for hand placement, THPs. Toilet Transfer Method: Proofreader: Raised toilet seat with arms (or 3-in-1 over toilet) Toileting - Clothing Manipulation: Simulated;Minimal assistance Where Assessed - Glass blower/designer Manipulation: Standing Toileting - Hygiene: Performed;Minimal assistance Where Assessed - Toileting Hygiene: Standing Tub/Shower Transfer: Not assessed Tub/Shower Transfer Method: Not assessed Equipment Used: Long-handled shoe horn;Long-handled sponge;Reacher;Rolling walker;Sock aid ADL Comments: Pt interested in AE kit as spouse works and she will have to be independent. She is able state 2/3 precautions from memory and the third after looking at precaution sheet. independently states PWB status. Introduced AE and will benefit from additional practice. Vision/Perception    Cognition Cognition Arousal/Alertness: Awake/alert Overall Cognitive Status: Appears within functional limits  for tasks assessed Orientation Level: Oriented X4 Sensation/Coordination Sensation Light Touch: Appears Intact Extremity Assessment RUE Assessment RUE Assessment: Within Functional Limits LUE Assessment LUE Assessment: Within Functional Limits Mobility  Transfers Sit to Stand: 4: Min assist;From chair/3-in-1;With upper extremity assist Sit to Stand Details (indicate cue type and reason): min verbal cues Stand to Sit: 4: Min assist;With upper extremity assist;To chair/3-in-1 Stand to Sit Details: min verbal cues Exercises   End of Session OT - End of Session Equipment Utilized During Treatment: Other (comment) (RW) Activity Tolerance: Patient tolerated treatment well Patient left: in chair;with call bell in reach;with family/visitor present General Behavior During Session: [facility] for tasks performed Cognition: [facility] for tasks performed   Lennox Laity [phone] 11/01/2011, 2:13 PM

## 2011-11-01 NOTE — Progress Notes (Signed)
Physical Therapy Treatment Patient Details Name: [name] MRN: [id number] DOB: [birth year] Today's Date: 11/01/2011  PT Assessment/Plan  PT - Assessment/Plan Comments on Treatment Session: practice stairs in am--6 steps with rail PT Plan: Discharge plan remains appropriate;Frequency remains appropriate Follow Up Recommendations: Home health PT Equipment Recommended: Rolling walker with 5" wheels;3 in 1 bedside comode;Tub/shower bench PT Goals  Acute Rehab PT Goals Pt will go Sit to Supine/Side: with min assist PT Goal: Sit to Supine/Side - Progress: Met Pt will go Sit to Stand: with supervision PT Goal: Sit to Stand - Progress: Progressing toward goal Pt will go Stand to Sit: with supervision PT Goal: Stand to Sit - Progress: Progressing toward goal Pt will Ambulate: 51 - 150 feet;with supervision;with rolling walker PT Goal: Ambulate - Progress: Progressing toward goal  PT Treatment Precautions/Restrictions  Precautions Precautions: Posterior Hip Precaution Booklet Issued:  (sign in room) Precaution Comments: sign in room Restrictions Weight Bearing Restrictions: Yes LLE Weight Bearing: Partial weight bearing LLE Partial Weight Bearing Percentage or Pounds: 50% Mobility (including Balance) Bed Mobility Sit to Supine: 4: Min assist;HOB flat Sit to Supine - Details (indicate cue type and reason): min with LLE; verbal cues for technique Transfers Sit to Stand: 5: Supervision;From chair/3-in-1;With armrests;With upper extremity assist Sit to Stand Details (indicate cue type and reason): cues for THP Stand to Sit: 5: Supervision;4: Min assist;To bed;With upper extremity assist Stand to Sit Details: verbal cues for hands and THP Ambulation/Gait Ambulation/Gait Assistance: 5: Supervision Ambulation/Gait Assistance Details (indicate cue type and reason): cues PWB and sequence and RW dist from self Ambulation Distance (Feet): 100 Feet Assistive device: Rolling walker Gait  Pattern: Step-to pattern    Exercise    End of Session General Behavior During Session: [facility] for tasks performed Cognition: [facility] for tasks performed  [facility] 11/01/2011, 2:27 PM

## 2011-11-01 NOTE — Progress Notes (Signed)
CSW met with pt today to assist with d/c planning. Pt indicates that she wants to return home. PT  indicates that this is a realistic plan. RNCM is aware of plan. CSW will remain available to assist with SNF placement if plan changes.

## 2011-11-01 NOTE — Progress Notes (Signed)
Patient ID: [name], female   DOB: [birth year], 56 y.o.   MRN: [id number] PO day 2.  Still requires PCA.  Hgb is 9.9

## 2011-11-02 LAB — CBC
MCH: 31.3 pg (ref 26.0–34.0)
MCHC: 34.6 g/dL (ref 30.0–36.0)
Platelets: 214 10*3/uL (ref 150–400)
RDW: 12.9 % (ref 11.5–15.5)

## 2011-11-02 NOTE — Progress Notes (Signed)
Occupational Therapy Treatment Patient Details Name: [name] MRN: [id number] DOB: Oct 13, 1955 Today's Date: 11/02/2011  OT Assessment/Plan OT Assessment/Plan Comments on Treatment Session: Pt making good progress with OT but was alittle tearful during session as she realized how tiring some tasks were with getting dressed, etc. Will benefit from HHOT. OT Plan: Discharge plan remains appropriate OT Frequency: Min 2X/week Follow Up Recommendations: Home health OT Equipment Recommended: 3 in 1 bedside comode;Rolling walker with 5" wheels;Tub/shower bench OT Goals ADL Goals ADL Goal: Grooming - Progress: Met ADL Goal: Lower Body Dressing - Progress: Progressing toward goals ADL Goal: Toilet Transfer - Progress: Met ADL Goal: Toileting - Clothing Manipulation - Progress: Met ADL Goal: Toileting - Hygiene - Progress: Met ADL Goal: Tub/Shower Transfer - Progress: Met  OT Treatment Precautions/Restrictions  Precautions Precautions: Posterior Hip Restrictions Weight Bearing Restrictions: Yes LLE Weight Bearing: Partial weight bearing LLE Partial Weight Bearing Percentage or Pounds: 50%   ADL ADL Grooming: Wash/dry hands;Performed;Supervision/safety Where Assessed - Grooming: Standing at sink Lower Body Dressing: Performed;Minimal assistance Lower Body Dressing Details (indicate cue type and reason): min assist with shoe horn for left foot as the shoe was tigther on foot than for right. likely due to edema. pt used reacher to don underwear and pants and sock aid to don socks. Used shoe horn with tennis shoes and kept laces tied. min verbal cues overall. Where Assessed - Lower Body Dressing: Sit to stand from chair Toilet Transfer: Performed;Supervision/safety Toilet Transfer Method: Proofreader: Raised toilet seat with arms (or 3-in-1 over toilet) Toileting - Clothing Manipulation: Simulated;Supervision/safety Where Assessed - Glass blower/designer  Manipulation: Standing Toileting - Hygiene: Performed;Supervision/safety Toileting - Hygiene Details (indicate cue type and reason): min verbal cues Where Assessed - Toileting Hygiene: Standing Tub/Shower Transfer: Minimal assistance;Simulated Tub/Shower Transfer Details (indicate cue type and reason): min assist to help lift left LE over simulated side of tub while patient leaned back on tubbench. Tub/Shower Transfer Equipment: Counsellor Used: Long-handled Therapist, occupational walker;Sock aid ADL Comments: pt performed toileting, dressing and tub bench transfer and was fatigued after these tasks. Had difficulty picking up left foot off the floor with increased fatigued and had to use hand to help lift it with some steps. Pt states the weight of her tennis shoe was more challenging also. Pt states husband purchased AE kit.  Mobility  Bed Mobility Bed Mobility: Yes Supine to Sit: 4: Min assist;HOB flat Supine to Sit Details (indicate cue type and reason): to help L LE off bed. min verbal cues. Sitting - Scoot to Edge of Bed: 5: Supervision Transfers Sit to Stand: 5: Supervision;From bed;From chair/3-in-1 Sit to Stand Details (indicate cue type and reason): verbal cue for L LE forward Stand to Sit: 5: Supervision;To chair/3-in-1 Stand to Sit Details: verbal cue for L LE forward Exercises    End of Session OT - End of Session Equipment Utilized During Treatment: Other (comment) (RW and AE) Activity Tolerance: Patient tolerated treatment well;Other (comment) (some fatigue after several tasks consecutively performed.) Patient left: in chair;with call bell in reach General Behavior During Session: [facility] for tasks performed Cognition: [facility] for tasks performed  Lennox Laity  [phone] 11/02/2011, 12:06 PM

## 2011-11-02 NOTE — Progress Notes (Signed)
Physical Therapy Treatment Patient Details Name: [name] MRN: [id number] DOB: 1955/11/27 Today's Date: 11/02/2011  PT Assessment/Plan  PT - Assessment/Plan Comments on Treatment Session: Pt reports feeling more confident with stairs.  Pt progressing well. PT Plan: Discharge plan remains appropriate;Frequency remains appropriate Follow Up Recommendations: Home health PT Equipment Recommended: Rolling walker with 5" wheels;3 in 1 bedside comode;Tub/shower seat PT Goals  Acute Rehab PT Goals PT Goal: Supine/Side to Sit - Progress: Met PT Goal: Sit to Stand - Progress: Met PT Goal: Stand to Sit - Progress: Met PT Goal: Ambulate - Progress: Progressing toward goal PT Goal: Up/Down Stairs - Progress: Progressing toward goal PT Goal: Perform Home Exercise Program - Progress: Progressing toward goal Additional Goals PT Goal: Additional Goal #1 - Progress: Met  PT Treatment Precautions/Restrictions  Precautions Precautions: Posterior Hip Precaution Booklet Issued:  (sign in room) Precaution Comments: sign in room Restrictions Weight Bearing Restrictions: Yes LLE Weight Bearing: Partial weight bearing LLE Partial Weight Bearing Percentage or Pounds: 50% Mobility (including Balance) Bed Mobility Bed Mobility: Yes Supine to Sit: 5: Supervision Supine to Sit Details (indicate cue type and reason): verbal cues to use sheet to assist L LE Sitting - Scoot to Edge of Bed: 5: Supervision Sit to Supine: 5: Supervision Sit to Supine - Details (indicate cue type and reason): verbal cues to use sheet to assist L LE, increased time Transfers Transfers: Yes Sit to Stand: 5: Supervision;With upper extremity assist;With armrests;From chair/3-in-1;From bed Sit to Stand Details (indicate cue type and reason): no cues required, supervision for safety Stand to Sit: To bed;To chair/3-in-1;With upper extremity assist;With armrests;5: Supervision Stand to Sit Details: no cues required,  supervision for safety Ambulation/Gait Ambulation/Gait: Yes Ambulation/Gait Assistance: 5: Supervision Ambulation/Gait Assistance Details (indicate cue type and reason): pt wore socks this time and reports easier moving L LE, again decreased distance to save energy and strength for exercises and just performed stairs Ambulation Distance (Feet): 40 Feet Assistive device: Rolling walker Gait Pattern: Step-to pattern Stairs: Yes Stairs Assistance: 4: Min assist Stairs Assistance Details (indicate cue type and reason): min/guard, verbal cues for sequence, spouse educated and assisted with stairs, pt reports feeling more confident Stair Management Technique: Step to pattern;Backwards;With walker Number of Stairs: 4     Exercise  Total Joint Exercises Ankle Circles/Pumps: AROM;10 reps;Both;Supine Quad Sets: AROM;Strengthening;Left;10 reps;Supine Heel Slides: AAROM;Strengthening;Left;10 reps;Supine Hip ABduction/ADduction: AAROM;Strengthening;Left;10 reps;Supine End of Session PT - End of Session Equipment Utilized During Treatment: Gait belt Activity Tolerance: Patient tolerated treatment well Patient left: in bed;with call bell in reach;with family/visitor present General Behavior During Session: [facility] for tasks performed Cognition: [facility] for tasks performed  [name],[name] 11/02/2011, 4:00 PM Pager: [phone]

## 2011-11-02 NOTE — Progress Notes (Signed)
Subjective: 3 Days Post-Op Procedure(s) (LRB): TOTAL HIP ARTHROPLASTY (Left) Patient reports pain as 3 on 0-10 scale.    Objective: Vital signs in last 24 hours: Temp:  [97.9 F (36.6 C)-98.7 F (37.1 C)] 98.5 F (36.9 C) (02/08 0553) Pulse Rate:  [91-97] 91  (02/08 0553) Resp:  [16] 16  (02/08 0553) BP: (107-110)/(69-73) 110/73 mmHg (02/08 0553) SpO2:  [92 %-96 %] 92 % (02/08 0553) FiO2 (%):  [97 %] 97 % (02/07 0800)  Intake/Output from previous day: 02/07 0701 - 02/08 0700 In: 1800 [P.O.:1800] Out: 800 [Urine:800] Intake/Output this shift:     Basename 11/02/11 0422 11/01/11 0356 10/31/11 0425  HGB 8.5* 9.9* 11.1*    Basename 11/02/11 0422 11/01/11 0356  WBC 9.5 10.6*  RBC 2.72* 3.26*  HCT 24.6* 29.6*  PLT 214 245   No results found for this basename: NA:2,K:2,CL:2,CO2:2,BUN:2,CREATININE:2,GLUCOSE:2,CALCIUM:2 in the last 72 hours No results found for this basename: LABPT:2,INR:2 in the last 72 hours  Neurologically intact  Assessment/Plan: 3 Days Post-Op Procedure(s) (LRB): TOTAL HIP ARTHROPLASTY (Left). Probably home in AM. Dry dressing today. doing well with PT/ADLs. Post-op anemia. Advance diet  [name],Lajean Boese L 11/02/2011, 7:09 AM

## 2011-11-02 NOTE — Progress Notes (Signed)
Physical Therapy Treatment Patient Details Name: [name] MRN: [id number] DOB: 01/15/1956 Today's Date: 11/02/2011  PT Assessment/Plan  PT - Assessment/Plan Comments on Treatment Session: Pt doing well with ambulation and practiced a couple stairs today however, pt crying end of session and states, "I'm don't know how I'm going to do this at home alone."  Pt reports husband works during day.  Explained to pt that she is progressing well with mobility and next PT session will go over any mobility concerns to build her confidence. PT Plan: Discharge plan remains appropriate;Frequency remains appropriate Follow Up Recommendations: Home health PT Equipment Recommended: Rolling walker with 5" wheels;3 in 1 bedside comode;Tub/shower bench PT Goals  Acute Rehab PT Goals PT Goal: Sit to Stand - Progress: Met PT Goal: Stand to Sit - Progress: Met PT Goal: Ambulate - Progress: Progressing toward goal PT Goal: Up/Down Stairs - Progress: Progressing toward goal Additional Goals PT Goal: Additional Goal #1 - Progress: Progressing toward goal  PT Treatment Precautions/Restrictions  Precautions Precautions: Posterior Hip Precaution Booklet Issued:  (sign in room) Precaution Comments: sign in room Restrictions Weight Bearing Restrictions: Yes LLE Weight Bearing: Partial weight bearing LLE Partial Weight Bearing Percentage or Pounds: 50% Mobility (including Balance) Transfers Transfers: Yes Sit to Stand: 5: Supervision;From bed;From chair/3-in-1 Sit to Stand Details (indicate cue type and reason): verbal cue for L LE forward Stand to Sit: 5: Supervision;To chair/3-in-1 Stand to Sit Details: verbal cue for L LE forward Ambulation/Gait Ambulation/Gait: Yes Ambulation/Gait Assistance: 5: Supervision Ambulation/Gait Assistance Details (indicate cue type and reason): pt reports difficulty advancing L LE 2* weight of shoe as well as fatigue from session with OT earlier so limited distance in  order to practice stairs Ambulation Distance (Feet): 40 Feet Assistive device: Rolling walker Gait Pattern: Step-to pattern Stairs: Yes Stairs Assistance: 4: Min assist Stairs Assistance Details (indicate cue type and reason): min/guard, verbal cues for sequence, hand placement and RW placement Stair Management Technique: Step to pattern;Backwards;With walker Number of Stairs: 2     Exercise    End of Session PT - End of Session Equipment Utilized During Treatment: Gait belt Activity Tolerance: Patient tolerated treatment well Patient left: in chair;with call bell in reach General Behavior During Session: [facility] for tasks performed Cognition: [facility] for tasks performed  Sirena Riddle,[name] 11/02/2011, 12:09 PM Pager: [phone]

## 2011-11-02 NOTE — Progress Notes (Signed)
  CARE MANAGEMENT NOTE 11/02/2011  Patient:  CassandraCassandra Jackson   Account Number:  [id number]  Date Initiated:  11/02/2011  Documentation initiated by:  Colleen Can  Subjective/Objective Assessment:   dx osteoarthritis left hip; total hip replacemnt     Action/Plan:   CM spoke with patien. Pt planning to go back to her home in [place], [place] with spouse. States spouse works during the day but will be with her at night. Pt is declining st snf currently   Anticipated DC Date:  11/03/2011   Anticipated DC Plan:  HOME W HOME HEALTH SERVICES  In-house referral  Clinical Social Worker      DC Associate Professor  CM consult      [facility] Choice  HOME HEALTH  DURABLE MEDICAL EQUIPMENT   Choice offered to / List presented to:  C-1 Patient   DME arranged  NA      DME agency  NA     HH arranged  HH-2 PT      [facility] agency  [facility]   Status of service:  Completed, signed off Medicare Important Message given?  NO  Comments:  11/02/2011  Pt was pre-arranged for [facility] services with Genevieve Norlander prior to admission. Choice of agencies for [facility] services offered; pt chose [place] . List of agencies placed on shadow chart . Pt will need DME which will be arranged by [facility] (tub transfer bench, RW, 3N1). HH services will start day after discharge.

## 2011-11-03 MED ORDER — METHOCARBAMOL 500 MG PO TABS: 500.0000 mg | ORAL_TABLET | Freq: Four times a day (QID) | ORAL | Status: AC | PRN

## 2011-11-03 MED ORDER — RIVAROXABAN 10 MG PO TABS: 10.0000 mg | ORAL_TABLET | Freq: Every day | ORAL | Status: DC

## 2011-11-03 MED ORDER — TRAMADOL HCL 50 MG PO TABS: 50.0000 mg | ORAL_TABLET | Freq: Four times a day (QID) | ORAL | Status: AC | PRN

## 2011-11-03 NOTE — Progress Notes (Signed)
Pt LBM 10/29/11 offered to medicate prior to discharge pt wanted to wait until she got home to take miralax.  Pt stable, scripts, equipment, and d/c instructions given.  No questions/concerns voiced.  Pt transported via wheelchair with NT and husband to private vehicle.

## 2011-11-03 NOTE — Progress Notes (Signed)
Patient ID: [name], female   DOB: 17-Nov-1955, 56 y.o.   MRN: [id number] Afebrile.  Cleared for discharge by PT.  Home arrangements made.  Discharge meds:Robaxin and vicodin.

## 2011-11-03 NOTE — Progress Notes (Signed)
CM spoke with pt concerning d/c planning. Per pt choice [facility] to provide [facility] services, DME. Dme,  delivered to room prior to discharge.Per pt husband to assist in home care. No other HH services or DME requested. [place] notified of pt discharge.  Leonie Green [phone]

## 2011-11-22 ENCOUNTER — Encounter (HOSPITAL_COMMUNITY): Payer: Self-pay | Admitting: Orthopedic Surgery

## 2011-12-14 NOTE — Discharge Summary (Signed)
[name], [name]          ACCOUNT NO.:  [id number]  MEDICAL RECORD NO.:  [id number]  LOCATION:  1604                         FACILITY:  [facility]  PHYSICIAN:  Marlowe Kays, M.D.  DATE OF BIRTH:  Mar 24, 1956  DATE OF ADMISSION:  10/30/2011 DATE OF DISCHARGE:  11/03/2011                              DISCHARGE SUMMARY   ADMITTING DIAGNOSIS:  Osteoarthritis of the left hip.  DISCHARGE DIAGNOSES: 1. Osteoarthritis of the left hip, but had mild postoperative anemia. 2. Hypothyroidism.  BRIEF HISTORY:  This lady had been seen by [name] for continued progressive problems concerning pain in her hip.  She is a very active lady and has been progressively essentially immobile on the operative hip from the arthritis.  She is now using a cane for assistance and ambulation.  X- rays have shown a complete worn out hip with bone-on-bone abutment upon the femoral and acetabular articulation.  Due to her activity, she has desired and we certainly agree a ceramic hip would be indicated.  After risks and benefits of surgery was described to the patient, we decided she would benefit with surgical intervention and was scheduled for same.  COURSE IN THE HOSPITAL:  She did very well postoperatively.  Did have some mild anemia postoperatively; however, she remained awake and alert, and essentially asymptomatic.  Neurovascular remained intact in the operative extremity.  She had minimal amount of drainage postoperatively.  She worked diligently with physical therapy and once she was able to ambulate with comfort and confidence, it was decided she could be maintained on an outpatient basis, and arrangements were made for her discharge.  DISCHARGE MEDICATIONS:  She will be discharged home on her home medication which is; 1. Synthroid 37.5 mcg in the morning. 2. Probiotic  formula daily. 3. Xarelto 10 mg daily. 4. She is given mild analgesics for discomfort and Robaxin as a muscle     relaxant. She  is to return to the office about 2 weeks after date of surgery.  The dressing was changed prior to discharge and again, the wound was essentially dry.  Should she have any questions, she is urged to call us.  She is to maintain 50% or less weightbear on the operative side, using a walker.     [name].   ______________________________ Marlowe Kays, M.D.    DLU/MEDQ  D:  12/14/2011  T:  12/14/2011  Job:  [id number]  cc:   Marlowe Kays, M.D. Fax: [phone]

## 2013-01-15 ENCOUNTER — Other Ambulatory Visit: Payer: Self-pay

## 2013-01-15 DIAGNOSIS — Z1231 Encounter for screening mammogram for malignant neoplasm of breast: Secondary | ICD-10-CM

## 2013-02-03 ENCOUNTER — Ambulatory Visit
Admission: RE | Admit: 2013-02-03 | Discharge: 2013-02-03 | Disposition: A | Discharge status: Home / Self Care | Payer: Managed Care, Other (non HMO) | Source: Ambulatory Visit

## 2013-02-03 DIAGNOSIS — Z1231 Encounter for screening mammogram for malignant neoplasm of breast: Secondary | ICD-10-CM

## 2013-07-17 ENCOUNTER — Ambulatory Visit (INDEPENDENT_AMBULATORY_CARE_PROVIDER_SITE_OTHER): Payer: Managed Care, Other (non HMO)

## 2013-07-17 VITALS — BP 130/80 | HR 74 | Resp 12 | Ht 69.0 in | Wt 160.0 lb

## 2013-07-17 DIAGNOSIS — B351 Tinea unguium: Secondary | ICD-10-CM

## 2013-07-17 NOTE — Patient Instructions (Signed)

## 2013-07-17 NOTE — Progress Notes (Signed)
  Subjective:    Patient ID: [name], female    DOB: [birth year], 56 y.o.   MRN: [id number]  HPI Comments: '' LT FOOT TOES ARE GETTING MUCH BETTER''  LASER TREATMENT #3  patient presents this time for 6 month followup in laser retreatment of mycotic nails. Affected nails are first fourth and fifth of left foot. Should note patient does have some concern about a new area on the distal lateral portion of the right hallux. Slight distal yellowing and subungual onychomycosis identified. Patient been applying topical antifungal formula 3 to the area of concern. The previous he treated nails are showing proximal clearing the fifth digit nail is almost completely cleared at this time. The hallux is cleared more than 50% at this point the fourth digit nail is clear approximately 70%.    Review of Systems not reviewed at this time.     Objective:   Physical Exam Neurovascular status is intact with pedal pulses palpable DP postal for PT postop over 4. Refill time 3 seconds all digits temperature warm. No edema rubor pallor or varicosities noted. Epicritic and proprioceptive sensations intact and symmetric bilateral. Orthopedic exam unremarkable and noncontributory. Nails right foot unremarkable except for distal lateral right hallux show some yellowing and subungual dystrophy and debris. The left hallux nail plate distal one third shows friability yellowing and discoloration as does the distal one third of the fourth toe. The remaining digits show normal color and density.       Assessment & Plan:  Assessment this time is improving onychomycosis with both laser treatment and topical antifungal utilizing formula 3. Plan at this time is for her third application of Q. clear laser treatment.  Report of treatment: The laser treatment is carried out as follows the laser set at energy level for 7.5 J of flouence. Frequency is 5 hz.. At this time the treatment was delivered to a total of 4 digits both  hallux were treated and the fourth and fifth digits of the left foot were treated a total of 504 pulses were delivered at 7.5 J . Patient tolerated this with little or no discomfort felt slight heat sensation only. Should note that the procedure was carried out with appropriate safety measures carried out including appropriate I wear and masks. Upon completion patient left the OR ambulating comfortably in followup in 6 months for reevaluation  Alvan Dame DPM

## 2014-01-15 ENCOUNTER — Ambulatory Visit (INDEPENDENT_AMBULATORY_CARE_PROVIDER_SITE_OTHER): Payer: Managed Care, Other (non HMO)

## 2014-01-15 ENCOUNTER — Ambulatory Visit: Payer: Managed Care, Other (non HMO)

## 2014-01-15 VITALS — BP 108/69 | HR 72 | Resp 16 | Ht 69.0 in | Wt 160.0 lb

## 2014-01-15 DIAGNOSIS — B351 Tinea unguium: Secondary | ICD-10-CM

## 2014-01-15 MED ORDER — TAVABOROLE 5 % EX SOLN: CUTANEOUS | Status: DC

## 2014-01-15 NOTE — Patient Instructions (Signed)
Onychomycosis/Fungal Toenails  WHAT IS IT? An infection that lies within the keratin of your nail plate that is caused by a fungus.  WHY ME? Fungal infections affect all ages, sexes, races, and creeds.  There may be many factors that predispose you to a fungal infection such as age, coexisting medical conditions such as diabetes, or an autoimmune disease; stress, medications, fatigue, genetics, etc.  Bottom line: fungus thrives in a warm, moist environment and your shoes offer such a location.  IS IT CONTAGIOUS? Theoretically, yes.  You do not want to share shoes, nail clippers or files with someone who has fungal toenails.  Walking around barefoot in the same room or sleeping in the same bed is unlikely to transfer the organism.  It is important to realize, however, that fungus can spread easily from one nail to the next on the same foot.  HOW DO WE TREAT THIS?  There are several ways to treat this condition.  Treatment may depend on many factors such as age, medications, pregnancy, liver and kidney conditions, etc.  It is best to ask your doctor which options are available to you.  1. No treatment.   Unlike many other medical concerns, you can live with this condition.  However for many people this can be a painful condition and may lead to ingrown toenails or a bacterial infection.  It is recommended that you keep the nails cut short to help reduce the amount of fungal nail. 2. Topical treatment.  These range from herbal remedies to prescription strength nail lacquers.  About 40-50% effective, topicals require twice daily application for approximately 9 to 12 months or until an entirely new nail has grown out.  The most effective topicals are medical grade medications available through physicians offices. 3. Oral antifungal medications.  With an 80-90% cure rate, the most common oral medication requires 3 to 4 months of therapy and stays in your system for a year as the new nail grows out.  Oral  antifungal medications do require blood work to make sure it is a safe drug for you.  A liver function panel will be performed prior to starting the medication and after the first month of treatment.  It is important to have the blood work performed to avoid any harmful side effects.  In general, this medication safe but blood work is required. 4. Laser Therapy.  This treatment is performed by applying a specialized laser to the affected nail plate.  This therapy is noninvasive, fast, and non-painful.  It is not covered by insurance and is therefore, out of pocket.  The results have been very good with a 80-95% cure rate.  The [facility] is the only practice in the area to offer this therapy. 5. Permanent Nail Avulsion.  Removing the entire nail so that a new nail will not grow back.  Apply the topical antifungal medication Kerydin as instructed once daily to the affected nails for 12 months duration .

## 2014-01-15 NOTE — Progress Notes (Signed)
   Subjective:    Patient ID: [name], female    DOB: 19-Nov-1955, 58 y.o.   MRN: [id number]  HPI Comments: Pt presents for laser treatment #4 of left 1, 3, 4, 5 and right 1st toenails.  Pt states she's not seeing a lot of improvement.     Review of Systems any systemic changes or findings noted     Objective:   Physical Exam Neurovascular status is intact pedal pulses are palpable epicritic and proprioceptive sensations intact patient continues to have some fungus in nails 134 and 5 on the left the right hallux nails resolved however been using topical formula 3 at this time we'll switch over to the new prescription Kerydin apply once daily for another 6-12 months to help resolve the nails completely although there is improvement not complete resolution at this time to no other abnormalities no systemic findings no new changes no open wounds or ulcerations noted neurovascular status is intact to       Assessment & Plan:  Assessment recalcitrant onychomycosis fungal nail infection despite 3 laser treatments in prolonged topical antifungal therapies we'll switch over to a new antifungal therapy topically recent reassess in 6-12 months for followup next  Harriet Masson DPM

## 2014-03-01 LAB — LIPID PANEL
Cholesterol: 153 mg/dL (ref 0–200)
HDL: 54 mg/dL (ref 35–70)
LDL CALC: 76 mg/dL
Triglycerides: 118 mg/dL (ref 40–160)

## 2014-03-01 LAB — TSH: TSH: 9.59 u[IU]/mL — AB (ref <=5.90)

## 2014-03-01 LAB — BASIC METABOLIC PANEL: GLUCOSE: 84 mg/dL

## 2014-03-03 ENCOUNTER — Other Ambulatory Visit: Payer: Self-pay | Admitting: Family Medicine

## 2014-03-03 ENCOUNTER — Other Ambulatory Visit (HOSPITAL_COMMUNITY)
Admission: RE | Admit: 2014-03-03 | Discharge: 2014-03-03 | Disposition: A | Discharge status: Home / Self Care | Payer: Managed Care, Other (non HMO) | Source: Ambulatory Visit | Attending: Family Medicine | Admitting: Family Medicine

## 2014-03-03 DIAGNOSIS — Z124 Encounter for screening for malignant neoplasm of cervix: Secondary | ICD-10-CM | POA: Diagnosis not present

## 2014-03-03 LAB — HM PAP SMEAR: HM PAP: NEGATIVE

## 2014-03-08 LAB — CYTOLOGY - PAP

## 2014-03-29 ENCOUNTER — Other Ambulatory Visit: Payer: Self-pay

## 2014-03-29 DIAGNOSIS — Z1231 Encounter for screening mammogram for malignant neoplasm of breast: Secondary | ICD-10-CM

## 2014-04-09 ENCOUNTER — Ambulatory Visit: Payer: Managed Care, Other (non HMO)

## 2014-04-09 ENCOUNTER — Ambulatory Visit
Admission: RE | Admit: 2014-04-09 | Discharge: 2014-04-09 | Disposition: A | Discharge status: Home / Self Care | Payer: Managed Care, Other (non HMO) | Source: Ambulatory Visit

## 2014-04-09 DIAGNOSIS — Z1231 Encounter for screening mammogram for malignant neoplasm of breast: Secondary | ICD-10-CM

## 2014-07-13 ENCOUNTER — Ambulatory Visit: Payer: Managed Care, Other (non HMO)

## 2014-07-16 ENCOUNTER — Ambulatory Visit: Payer: Managed Care, Other (non HMO)

## 2014-08-03 ENCOUNTER — Ambulatory Visit: Payer: Managed Care, Other (non HMO)

## 2014-08-09 ENCOUNTER — Ambulatory Visit (INDEPENDENT_AMBULATORY_CARE_PROVIDER_SITE_OTHER): Payer: Managed Care, Other (non HMO)

## 2014-08-09 VITALS — BP 127/90 | HR 76 | Resp 12

## 2014-08-09 DIAGNOSIS — L603 Nail dystrophy: Secondary | ICD-10-CM

## 2014-08-09 DIAGNOSIS — B351 Tinea unguium: Secondary | ICD-10-CM

## 2014-08-09 DIAGNOSIS — B070 Plantar wart: Secondary | ICD-10-CM

## 2014-08-09 NOTE — Progress Notes (Signed)
   Subjective:    Patient ID: [name], female    DOB: 1956/08/01, 58 y.o.   MRN: [id number]  HPI ''LT FOOT 1,3,4,5 AND 1ST TOENAILS ARE DOING MUCH BETTER.''  NEW PROBLEM:  PT STATED LT BALL OF THE FOOT HAVE CALLUS FOR 10 YEARS. FOOT IS GETTING BETTER SOMETIMES THE SKIN IS GETTING THICKER AND GET AGGRAVATED BY WALKING/PRESSURE. TRIED TO KEEP TRIM AS NEEDED.  Review of Systems  All other systems reviewed and are negative.      Objective:   Physical Exam 58 year old white female options this time for 2 reasons once proxy 6 month follow-up for antifungal treatment utilizing Kerydin patient is been applying Kerydin to the affected nails hallux and second digits with significant improvement starting to show no pain no discomfort there is proximal clearing distal dystrophy discoloration friability of nails still present they're not painful or symptomatic at this point will continue with topical Kerydin as instructed  Second issue this time is a new problem patient's patient having a keratotic lesion subsecond MTP area left with a well-circumscribed core painful on direct lateral compression been there for several monthshe had previous treatment other than scraping on the area by herself. No other new problems or difficulties neurovascular status is intact pedal pulses are palpable epicritic and proprioceptive sensations intact does have a history of onychomycosis being treated with topical antifungal at this time lesion appears to be consistent with verruca plantaris or poor keratoses. This time lesions are debrided pack to 65% salicylic acid under occlusion for 24 hours. Occlusal instruction sheet is given to the patient       Assessment & Plan:  Assessment good progress with topical Kerydin nail antifungal treatment continue for another 6 months as indicated.  Assessment number to is verruca plantaris plantar left forefoot lesion is debrided and packed with 79% salicylic acid under  occlusion will continue with salicylic acid treatment and home follow-up occlusals instruction sheet reappointed one month if fails to resolve or improve or if there is recurrence. Apply the topical salicylic acid daily and cover with duct tape as instructed patient will follow-up if any pain or symptomology were to occur recur or change. Suggest six-month long-term follow-up for mycotic nail next  Harriet Masson DPM

## 2014-08-09 NOTE — Patient Instructions (Signed)

## 2014-08-10 ENCOUNTER — Ambulatory Visit: Payer: Managed Care, Other (non HMO)

## 2014-12-23 ENCOUNTER — Other Ambulatory Visit: Payer: Self-pay | Admitting: *Deleted

## 2014-12-23 MED ORDER — TAVABOROLE 5 % EX SOLN: CUTANEOUS | Status: DC

## 2014-12-23 NOTE — Telephone Encounter (Signed)
[facility] sent a refill request for [place].  Dr. Noralyn Pick the refill.

## 2015-03-10 ENCOUNTER — Other Ambulatory Visit: Payer: Self-pay

## 2015-03-10 DIAGNOSIS — Z1231 Encounter for screening mammogram for malignant neoplasm of breast: Secondary | ICD-10-CM

## 2015-04-12 ENCOUNTER — Ambulatory Visit
Admission: RE | Admit: 2015-04-12 | Discharge: 2015-04-12 | Disposition: A | Discharge status: Home / Self Care | Payer: Managed Care, Other (non HMO) | Source: Ambulatory Visit

## 2015-04-12 DIAGNOSIS — Z1231 Encounter for screening mammogram for malignant neoplasm of breast: Secondary | ICD-10-CM

## 2015-04-12 LAB — HM MAMMOGRAPHY

## 2015-08-29 ENCOUNTER — Other Ambulatory Visit (HOSPITAL_COMMUNITY): Payer: Self-pay | Admitting: Gastroenterology

## 2015-08-29 DIAGNOSIS — R945 Abnormal results of liver function studies: Secondary | ICD-10-CM

## 2015-08-29 DIAGNOSIS — R1013 Epigastric pain: Secondary | ICD-10-CM

## 2015-08-29 DIAGNOSIS — B182 Chronic viral hepatitis C: Secondary | ICD-10-CM

## 2015-08-31 ENCOUNTER — Ambulatory Visit (HOSPITAL_COMMUNITY): Payer: Managed Care, Other (non HMO)

## 2015-09-02 ENCOUNTER — Ambulatory Visit
Admission: RE | Admit: 2015-09-02 | Discharge: 2015-09-02 | Disposition: A | Discharge status: Home / Self Care | Payer: Managed Care, Other (non HMO) | Source: Ambulatory Visit | Attending: Gastroenterology | Admitting: Gastroenterology

## 2015-09-02 DIAGNOSIS — B182 Chronic viral hepatitis C: Secondary | ICD-10-CM | POA: Diagnosis not present

## 2015-09-02 DIAGNOSIS — R945 Abnormal results of liver function studies: Secondary | ICD-10-CM

## 2015-09-02 DIAGNOSIS — R1013 Epigastric pain: Secondary | ICD-10-CM | POA: Insufficient documentation

## 2015-09-02 DIAGNOSIS — R7989 Other specified abnormal findings of blood chemistry: Secondary | ICD-10-CM | POA: Insufficient documentation

## 2015-09-20 ENCOUNTER — Ambulatory Visit (HOSPITAL_COMMUNITY): Payer: Managed Care, Other (non HMO)

## 2016-02-21 ENCOUNTER — Ambulatory Visit (INDEPENDENT_AMBULATORY_CARE_PROVIDER_SITE_OTHER): Payer: Managed Care, Other (non HMO) | Admitting: Sports Medicine

## 2016-02-21 ENCOUNTER — Encounter: Payer: Self-pay | Admitting: Sports Medicine

## 2016-02-21 VITALS — BP 108/74 | HR 78 | Resp 12

## 2016-02-21 DIAGNOSIS — L603 Nail dystrophy: Secondary | ICD-10-CM | POA: Diagnosis not present

## 2016-02-21 DIAGNOSIS — M79675 Pain in left toe(s): Secondary | ICD-10-CM

## 2016-02-21 NOTE — Progress Notes (Signed)
Patient ID: [name], female   DOB: 31-Oct-1955, 60 y.o.   MRN: [id number] Subjective: [name] is a 60 y.o. female patient seen today in office with complaint of painful thickened and discolored nails at left hallux and 3rd toe. Patient is desiring treatment for nail changes; has tried OTC topicals/Formula 3/laser/Kerydin in the past with no improvement. Reports that nails are becoming difficult to manage because of the thickness. Patient has no other pedal complaints at this time.   There are no active problems to display for this patient.   Current Outpatient Prescriptions on File Prior to Visit  Medication Sig Dispense Refill  . Homeopathic Products (ARNICA [place] PO) Place 2 tablets under the tongue daily.    Marland Kitchen levothyroxine (SYNTHROID, LEVOTHROID) 75 MCG tablet Take 37.5 mcg by mouth daily before breakfast.     . Probiotic Product (PROBIOTIC FORMULA PO) Take 1 capsule by mouth daily.    . rivaroxaban (XARELTO) 10 MG TABS tablet Take 1 tablet (10 mg total) by mouth daily with breakfast. 24 tablet 0  . Tavaborole (KERYDIN) 5 % SOLN Apply 1 drop each affected nail once daily for 12 months 10 mL 2   No current facility-administered medications on file prior to visit.    No Known Allergies  Objective: Physical Exam  General: Well developed, nourished, no acute distress, awake, alert and oriented x 3  Vascular: Dorsalis pedis artery 2/4 bilateral, Posterior tibial artery 2/4 bilateral, skin temperature warm to warm proximal to distal bilateral lower extremities, no varicosities, pedal hair present bilateral.  Neurological: Gross sensation present via light touch bilateral.   Dermatological: Skin is warm, dry, and supple bilateral, Nails 1 and 3 on left are tender, short thick, and discolored with mild subungal debris, all other nails within normal limits, no webspace macerations present bilateral, no open lesions present bilateral, no callus/corns/hyperkeratotic tissue  present bilateral. No signs of infection bilateral. Subjective red itchy areas to both feet suggestive of possible early tinea.   Musculoskeletal: No symptomatic boney deformities noted bilateral. Muscular strength within normal limits without pain on range of motion. No pain with calf compression bilateral.  Assessment and Plan:  Problem List Items Addressed This Visit    None    Visit Diagnoses    Nail dystrophy    -  Primary    Relevant Orders    Fungus Culture with Smear    Toe pain, left          -Examined patient -Discussed treatment options for painful dystrophic nails and possible early tinea -Fungal culture was obtained and sent to [facility] lab of left hallux and 3rd toenail -Recommend consider lamisil AT cream for skin, patient is also following up with dermatologist for possible similar rash on back that comes and goes -Patient to return in 4 weeks for follow up evaluation and discussion of fungal culture results or sooner if symptoms worsen.  [name], DPM

## 2016-03-20 ENCOUNTER — Encounter: Payer: Self-pay | Admitting: Sports Medicine

## 2016-03-20 ENCOUNTER — Ambulatory Visit (INDEPENDENT_AMBULATORY_CARE_PROVIDER_SITE_OTHER): Payer: Managed Care, Other (non HMO) | Admitting: Sports Medicine

## 2016-03-20 DIAGNOSIS — B351 Tinea unguium: Secondary | ICD-10-CM

## 2016-03-20 DIAGNOSIS — M79675 Pain in left toe(s): Secondary | ICD-10-CM

## 2016-03-20 NOTE — Progress Notes (Signed)
Patient ID: Xia Belyeu, female   DOB: 1956/06/14, 60 y.o.   MRN: [id number] Subjective: Janvi Munafo is a 60 y.o. female patient seen today in office for fungal culture results. Patient has no other pedal complaints at this time.   There are no active problems to display for this patient.   Current Outpatient Prescriptions on File Prior to Visit  Medication Sig Dispense Refill  . Homeopathic Products (ARNICA [place] PO) Place 2 tablets under the tongue daily.    Marland Kitchen levothyroxine (SYNTHROID, LEVOTHROID) 75 MCG tablet Take 37.5 mcg by mouth daily before breakfast.     . Probiotic Product (PROBIOTIC FORMULA PO) Take 1 capsule by mouth daily.    . rivaroxaban (XARELTO) 10 MG TABS tablet Take 1 tablet (10 mg total) by mouth daily with breakfast. 24 tablet 0  . Tavaborole (KERYDIN) 5 % SOLN Apply 1 drop each affected nail once daily for 12 months 10 mL 2   No current facility-administered medications on file prior to visit.    No Known Allergies  Objective: Physical Exam  General: Well developed, nourished, no acute distress, awake, alert and oriented x 3  Vascular: Dorsalis pedis artery 2/4 bilateral, Posterior tibial artery 2/4 bilateral, skin temperature warm to warm proximal to distal bilateral lower extremities, no varicosities, pedal hair present bilateral.  Neurological: Gross sensation present via light touch bilateral.   Dermatological: Skin is warm, dry, and supple bilateral, Nails 1 and 3 on left are tender, short thick, and discolored with mild subungal debris, all other nails within normal limits, no webspace macerations present bilateral, no open lesions present bilateral, no callus/corns/hyperkeratotic tissue present bilateral. No signs of infection bilateral. Subjective occasional red itchy areas that could be early tinea at both feet.   Musculoskeletal: No boney deformities noted bilateral. Muscular strength within normal limits without painon range of motion. No  pain with calf compression bilateral.  Fungal culture + T. Rubrum  Assessment and Plan:  Problem List Items Addressed This Visit    None    Visit Diagnoses    Dermatophytosis of nail    -  Primary    Relevant Orders    Hepatic Function Panel    Toe pain, left          -Examined patient -Discussed treatment options for painful mycotic nails -Patient opt for oral Lamisil with full understanding of medication risks; ordered LFTs for review if within normal limits will proceed with sending Rx to pharmacy for lamisil 250mg  PO daily. Anticipate 12 week course.  -Patient opt for topical and also will purchase formula 3 to use to nails after filing daily in hopes to get a better result since she has had  kerydin and laser in the past with no resolution  -Advised good hygiene habits and encouraged her to have her husband to come for an evaluation as well -Patient to return in 6 weeks for follow up evaluation or sooner if symptoms worsen.  Landis Martins, DPM

## 2016-03-22 LAB — HEPATIC FUNCTION PANEL
ALBUMIN: 4.2 g/dL (ref 3.6–5.1)
ALK PHOS: 55 U/L (ref 33–130)
ALT: 15 U/L (ref 6–29)
AST: 17 U/L (ref 10–35)
BILIRUBIN INDIRECT: 0.3 mg/dL (ref 0.2–1.2)
Bilirubin, Direct: 0.1 mg/dL (ref <=0.2)
TOTAL PROTEIN: 7.5 g/dL (ref 6.1–8.1)
Total Bilirubin: 0.4 mg/dL (ref 0.2–1.2)

## 2016-03-23 ENCOUNTER — Telehealth: Payer: Self-pay | Admitting: Sports Medicine

## 2016-03-23 DIAGNOSIS — B351 Tinea unguium: Secondary | ICD-10-CM

## 2016-03-23 MED ORDER — TERBINAFINE HCL 250 MG PO TABS
250.0000 mg | ORAL_TABLET | Freq: Every day | ORAL | Status: DC
Start: 2016-03-23 — End: 2017-01-07

## 2016-03-23 NOTE — Telephone Encounter (Signed)
Liver function results normal. Sent to patient Lamisil 250mg  PO to start for nail fungus. Left voicemail for patient informing her that the medication has been sent to pharmacy. -Dr. Cannon Kettle

## 2016-04-06 ENCOUNTER — Encounter: Payer: Self-pay | Admitting: Sports Medicine

## 2016-05-01 ENCOUNTER — Ambulatory Visit (INDEPENDENT_AMBULATORY_CARE_PROVIDER_SITE_OTHER): Payer: Managed Care, Other (non HMO) | Admitting: Sports Medicine

## 2016-05-01 ENCOUNTER — Encounter: Payer: Self-pay | Admitting: Sports Medicine

## 2016-05-01 DIAGNOSIS — M79675 Pain in left toe(s): Secondary | ICD-10-CM

## 2016-05-01 DIAGNOSIS — Z79899 Other long term (current) drug therapy: Secondary | ICD-10-CM

## 2016-05-01 DIAGNOSIS — B351 Tinea unguium: Secondary | ICD-10-CM

## 2016-05-01 NOTE — Progress Notes (Signed)
Patient ID: [name], female   DOB: [birth year], 60 y.o.   MRN: [id number] Subjective: [name] is a 60 y.o. female patient seen today in office for follow up eval after starting 03-23-16 Lamisil for nail fungus. Patient has no other pedal complaints at this time.   There are no active problems to display for this patient.   Current Outpatient Prescriptions on File Prior to Visit  Medication Sig Dispense Refill  . Homeopathic Products (ARNICA [place] PO) Place 2 tablets under the tongue daily.    [name] levothyroxine (SYNTHROID, LEVOTHROID) 75 MCG tablet Take 37.5 mcg by mouth daily before breakfast.     . Probiotic Product (PROBIOTIC FORMULA PO) Take 1 capsule by mouth daily.    . rivaroxaban (XARELTO) 10 MG TABS tablet Take 1 tablet (10 mg total) by mouth daily with breakfast. 24 tablet 0  . Tavaborole (KERYDIN) 5 % SOLN Apply 1 drop each affected nail once daily for 12 months 10 mL 2  . terbinafine (LAMISIL) 250 MG tablet Take 1 tablet (250 mg total) by mouth daily. 30 tablet 2   No current facility-administered medications on file prior to visit.     No Known Allergies  Objective: Physical Exam  General: Well developed, nourished, no acute distress, awake, alert and oriented x 3  Vascular: Dorsalis pedis artery 2/4 bilateral, Posterior tibial artery 2/4 bilateral, skin temperature warm to warm proximal to distal bilateral lower extremities, no varicosities, pedal hair present bilateral.  Neurological: Gross sensation present via light touch bilateral.   Dermatological: Skin is warm, dry, and supple bilateral, Nails 1 and 3 on left are tender, short thick, and discolored with mild subungal debris, all other nails within normal limits, no webspace macerations present bilateral, no open lesions present bilateral, no callus/corns/hyperkeratotic tissue present bilateral. No signs of infection bilateral. Subjective occasional red itchy areas that could be early tinea at both  feet, improved.   Musculoskeletal: No boney deformities noted bilateral. Muscular strength within normal limits without painon range of motion. No pain with calf compression bilateral.  Fungal culture + T. 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 Anticipate 12 week course to end 06-23-16; Will call patient to discuss LFTs, if abnormal will stop medication, if normal will continue Rx.  -Cont with formula 3 to nails and filing nails 1x weekly -Advised good hygiene habits -Patient to return in 6 weeks for follow up evaluation or sooner if symptoms worsen.  [name], DPM

## 2016-05-21 ENCOUNTER — Other Ambulatory Visit: Payer: Self-pay | Admitting: Family Medicine

## 2016-05-21 DIAGNOSIS — Z1231 Encounter for screening mammogram for malignant neoplasm of breast: Secondary | ICD-10-CM

## 2016-05-25 ENCOUNTER — Ambulatory Visit
Admission: RE | Admit: 2016-05-25 | Discharge: 2016-05-25 | Disposition: A | Discharge status: Home / Self Care | Payer: Managed Care, Other (non HMO) | Source: Ambulatory Visit | Attending: Family Medicine | Admitting: Family Medicine

## 2016-05-25 DIAGNOSIS — Z1231 Encounter for screening mammogram for malignant neoplasm of breast: Secondary | ICD-10-CM

## 2016-06-04 ENCOUNTER — Encounter: Payer: Self-pay | Admitting: Family Medicine

## 2016-06-04 ENCOUNTER — Ambulatory Visit (INDEPENDENT_AMBULATORY_CARE_PROVIDER_SITE_OTHER): Payer: Managed Care, Other (non HMO) | Admitting: Family Medicine

## 2016-06-04 DIAGNOSIS — Z96642 Presence of left artificial hip joint: Secondary | ICD-10-CM

## 2016-06-04 DIAGNOSIS — B351 Tinea unguium: Secondary | ICD-10-CM | POA: Diagnosis not present

## 2016-06-04 DIAGNOSIS — E038 Other specified hypothyroidism: Secondary | ICD-10-CM | POA: Diagnosis not present

## 2016-06-04 DIAGNOSIS — R5383 Other fatigue: Secondary | ICD-10-CM | POA: Insufficient documentation

## 2016-06-04 DIAGNOSIS — Z96649 Presence of unspecified artificial hip joint: Secondary | ICD-10-CM | POA: Insufficient documentation

## 2016-06-04 DIAGNOSIS — M6289 Other specified disorders of muscle: Secondary | ICD-10-CM

## 2016-06-04 DIAGNOSIS — B1920 Unspecified viral hepatitis C without hepatic coma: Secondary | ICD-10-CM

## 2016-06-04 DIAGNOSIS — G4700 Insomnia, unspecified: Secondary | ICD-10-CM

## 2016-06-04 DIAGNOSIS — Z87891 Personal history of nicotine dependence: Secondary | ICD-10-CM

## 2016-06-04 DIAGNOSIS — E039 Hypothyroidism, unspecified: Secondary | ICD-10-CM | POA: Insufficient documentation

## 2016-06-04 DIAGNOSIS — R5382 Chronic fatigue, unspecified: Secondary | ICD-10-CM

## 2016-06-04 NOTE — Assessment & Plan Note (Addendum)
recently finished [facility] txmnt---> Dr Paulita Fujita.  Tells me that there is a 0 viral load currently

## 2016-06-04 NOTE — Assessment & Plan Note (Addendum)
Dr Cannon Kettle- Podiatry.   Treated with terbinafine orally by them.

## 2016-06-04 NOTE — Assessment & Plan Note (Addendum)
Takes homeopathic meds for sleep.  Bad for last 10 yrs.    patient prefers to take homeopathic medicines for this and will increase her melatonin dosage. Not sure what she is on currently. Explained she can go up to 15 mg nightly.   Discussed sleep hygiene

## 2016-06-04 NOTE — Assessment & Plan Note (Addendum)
For 2 years now.   Last PCP did not do labs etc.  Pt thinks it is likely lifestyle and lack of exercise but she is worried about this being her thyroid issue.  Obtain fasting labs near future

## 2016-06-04 NOTE — Assessment & Plan Note (Addendum)
Quit in July '17 and about 30 yrs + at 1/4 ppd.  Patient understands this will put her at increased risk for diseases in the future. Discussed importance of complete physical exam which we focused on preventative care and health maintenance screenings etc.

## 2016-06-04 NOTE — Assessment & Plan Note (Addendum)
Dx in 2010.  Difficult to control for 5 yrs and --> for no reason, her tsh would be really bad etc.  patient's primary concern today is that her thyroid is really off which is making her feel weak, difficult to lose weight, generalized achy all over, and some sleep difficulties.  - We'll obtain thyroid lab work near future

## 2016-06-04 NOTE — Assessment & Plan Note (Addendum)
For yrs as well.    Not distal or peripheral muscles.   Did go to PT for muscle weakness 5, 6, and 7/ 2017- felt much better with her regular exercising.    She has really slacked off recently and now muscle weaknesses and fatigue is bad again b/c diff to find time to exercise.     She does think this fatigue is due to deconditioning and inactivity but wants labs.

## 2016-06-04 NOTE — Patient Instructions (Signed)
Insomnia Insomnia is a sleep disorder that makes it difficult to fall asleep or to stay asleep. Insomnia can cause tiredness (fatigue), low energy, difficulty concentrating, mood swings, and poor performance at work or school.  There are three different ways to classify insomnia:  Difficulty falling asleep.  Difficulty staying asleep.  Waking up too early in the morning. Any type of insomnia can be long-term (chronic) or short-term (acute). Both are common. Short-term insomnia usually lasts for three months or less. Chronic insomnia occurs at least three times a week for longer than three months. CAUSES  Insomnia may be caused by another condition, situation, or substance, such as:  Anxiety.  Certain medicines.  Gastroesophageal reflux disease (GERD) or other gastrointestinal conditions.  Asthma or other breathing conditions.  Restless legs syndrome, sleep apnea, or other sleep disorders.  Chronic pain.  Menopause. This may include hot flashes.  Stroke.  Abuse of alcohol, tobacco, or illegal drugs.  Depression.  Caffeine.   Neurological disorders, such as Alzheimer disease.  An overactive thyroid (hyperthyroidism). The cause of insomnia may not be known. RISK FACTORS Risk factors for insomnia include:  Gender. Women are more commonly affected than men.  Age. Insomnia is more common as you get older.  Stress. This may involve your professional or personal life.  Income. Insomnia is more common in people with lower income.  Lack of exercise.   Irregular work schedule or night shifts.  Traveling between different time zones. SIGNS AND SYMPTOMS If you have insomnia, trouble falling asleep or trouble staying asleep is the main symptom. This may lead to other symptoms, such as:  Feeling fatigued.  Feeling nervous about going to sleep.  Not feeling rested in the morning.  Having trouble concentrating.  Feeling irritable, anxious, or  depressed. TREATMENT  Treatment for insomnia depends on the cause. If your insomnia is caused by an underlying condition, treatment will focus on addressing the condition. Treatment may also include:   Medicines to help you sleep.  Counseling or therapy.  Lifestyle adjustments. HOME CARE INSTRUCTIONS   Take medicines only as directed by your health care provider.  Keep regular sleeping and waking hours. Avoid naps.  Keep a sleep diary to help you and your health care provider figure out what could be causing your insomnia. Include:   When you sleep.  When you wake up during the night.  How well you sleep.   How rested you feel the next day.  Any side effects of medicines you are taking.  What you eat and drink.   Make your bedroom a comfortable place where it is easy to fall asleep:  Put up shades or special blackout curtains to block light from outside.  Use a white noise machine to block noise.  Keep the temperature cool.   Exercise regularly as directed by your health care provider. Avoid exercising right before bedtime.  Use relaxation techniques to manage stress. Ask your health care provider to suggest some techniques that may work well for you. These may include:  Breathing exercises.  Routines to release muscle tension.  Visualizing peaceful scenes.  Cut back on alcohol, caffeinated beverages, and cigarettes, especially close to bedtime. These can disrupt your sleep.  Do not overeat or eat spicy foods right before bedtime. This can lead to digestive discomfort that can make it hard for you to sleep.  Limit screen use before bedtime. This includes:  Watching TV.  Using your smartphone, tablet, and computer.  Stick to a routine. This  can help you fall asleep faster. Try to do a quiet activity, brush your teeth, and go to bed at the same time each night.  Get out of bed if you are still awake after 15 minutes of trying to sleep. Keep the lights  down, but try reading or doing a quiet activity. When you feel sleepy, go back to bed.  Make sure that you drive carefully. Avoid driving if you feel very sleepy.  Keep all follow-up appointments as directed by your health care provider. This is important. SEEK MEDICAL CARE IF:   You are tired throughout the day or have trouble in your daily routine due to sleepiness.  You continue to have sleep problems or your sleep problems get worse. SEEK IMMEDIATE MEDICAL CARE IF:   You have serious thoughts about hurting yourself or someone else.   This information is not intended to replace advice given to you by your health care provider. Make sure you discuss any questions you have with your health care provider.   Document Released: 09/07/2000 Document Revised: 06/01/2015 Document Reviewed: 06/11/2014 Elsevier Interactive Patient Education 2016 [employer].       Adjustment Disorder Adjustment disorder is an unusually severe reaction to a stressful life event, such as the loss of a job or physical illness. The event may be any stressful event other than the loss of a loved one. Adjustment disorder may affect your feelings, your thinking, how you act, or a combination of these. It may interfere with personal relationships or with the way you are at work, school, or home. People with this disorder are at risk for suicide and substance abuse. They may develop a more serious mental disorder, such as major depressive disorder or post-traumatic stress disorder. SIGNS AND SYMPTOMS  Symptoms may include:  Sadness, depressed mood, or crying spells.  Loss of enjoyment.  Change in appetite or weight.  Sense of loss or hopelessness.  Thoughts of suicide.  Anxiety, worry, or nervousness.  Trouble sleeping.  Avoiding family and friends.  Poor school performance.  Fighting or vandalism.  Reckless driving.  Skipping school.  Poor work Systems analyst.  Ignoring bills. Symptoms of  adjustment disorder start within 3 months of the stressful life event. They do not last more than 6 months after the event has ended. DIAGNOSIS  To make a diagnosis, your health care provider will ask about what has happened in your life and how it has affected you. He or she may also ask about your medical history and use of medicines, alcohol, and other substances. Your health care provider may do a physical exam and order lab tests or other studies. You may be referred to a mental health specialist for evaluation. TREATMENT  Treatment options include:  Counseling or talk therapy. Talk therapy is usually provided by mental health specialists.  Medicine. Certain medicines may help with depression, anxiety, and sleep.  Support groups. Support groups offer emotional support, advice, and guidance. They are made up of people who have had similar experiences. HOME CARE INSTRUCTIONS  Keep all follow-up visits as directed by your health care provider. This is important.  Take medicines only as directed by your health care provider. SEEK MEDICAL CARE IF:  Your symptoms get worse.  SEEK IMMEDIATE MEDICAL CARE IF: You have serious thoughts about hurting yourself or someone else. MAKE SURE YOU:  Understand these instructions.  Will watch your condition.  Will get help right away if you are not doing well or get worse.  This information is not intended to replace advice given to you by your health care provider. Make sure you discuss any questions you have with your health care provider.   Document Released: 05/15/2006 Document Revised: 10/01/2014 Document Reviewed: 02/02/2014 Elsevier Interactive Patient Education [employer].

## 2016-06-04 NOTE — Progress Notes (Signed)
New patient office visit note:  Impression and Recommendations:    1. Other specified hypothyroidism   2. Nail fungus   3. History of left hip replacement   4. Insomnia   5. Chronic fatigue   6. Muscle fatigue   7. Ex-cigarette smoker   8. Hepatitis C virus infection, unspecified chronicity     Fasting labs ordered.  Hypothyroidism Dx in 2010.  Difficult to control for 5 yrs and --> for no reason, her tsh would be really bad etc.  patient's primary concern today is that her thyroid is really off which is making her feel weak, difficult to lose weight, generalized achy all over, and some sleep difficulties.  - We'll obtain thyroid lab work near future  Nail fungus Dr Cannon Kettle- Podiatry.   Treated with terbinafine orally by them.  Insomnia Takes homeopathic meds for sleep.  Bad for last 10 yrs.    patient prefers to take homeopathic medicines for this and will increase her melatonin dosage. Not sure what she is on currently. Explained she can go up to 15 mg nightly.   Discussed sleep hygiene  Feeling exhausted For 2 years now.   Last PCP did not do labs etc.  Pt thinks it is likely lifestyle and lack of exercise but she is worried about this being her thyroid issue.  Obtain fasting labs near future  Muscle fatigue For yrs as well.    Not distal or peripheral muscles.   Did go to PT for muscle weakness 5, 6, and 7/ 2017- felt much better with her regular exercising.    She has really slacked off recently and now muscle weaknesses and fatigue is bad again b/c diff to find time to exercise.     She does think this fatigue is due to deconditioning and inactivity but wants labs.  Ex-cigarette smoker Quit in July '17 and about 30 yrs + at 1/4 ppd.  Patient understands this will put her at increased risk for diseases in the future. Discussed importance of complete physical exam which we focused on preventative care and health maintenance screenings etc.  Hepatitis C  infection recently finished [facility] tx[facility]---> Dr Paulita Fujita.  Tells me that there is a 0 viral load currently    Orders Placed This Encounter  Procedures  . CBC with Differential/Platelet  . Comprehensive metabolic panel  . Hemoglobin A1c  . Lipid panel  . TSH  . Vitamin B12  . VITAMIN D 25 Hydroxy (Vit-D Deficiency, Fractures)  . Magnesium  . Phosphorus  . T4, free    Patient's Medications  New Prescriptions   No medications on file  Previous Medications   PROBIOTIC PRODUCT (PROBIOTIC FORMULA PO)    Take 1 capsule by mouth daily.   SYNTHROID 100 MCG TABLET    Take 1 tablet by mouth daily.   TERBINAFINE (LAMISIL) 250 MG TABLET    Take 1 tablet (250 mg total) by mouth daily.  Modified Medications   No medications on file  Discontinued Medications   HOMEOPATHIC PRODUCTS (ARNICA [place] PO)    Place 2 tablets under the tongue daily.   LEVOTHYROXINE (SYNTHROID, LEVOTHROID) 75 MCG TABLET    Take 37.5 mcg by mouth daily before breakfast.    RIVAROXABAN (XARELTO) 10 MG TABS TABLET    Take 1 tablet (10 mg total) by mouth daily with breakfast.   TAVABOROLE (KERYDIN) 5 % SOLN    Apply 1 drop each affected nail once daily for 12  months    Return for 1-2 wks fastign bldwrk and then OV with me 2 wks later. .  The patient was counseled, risk factors were discussed, anticipatory guidance given.  Gross side effects, risk and benefits, and alternatives of medications discussed with patient.  Patient is aware that all medications have potential side effects and we are unable to predict every side effect or drug-drug interaction that may occur.  Expresses verbal understanding and consents to current therapy plan and treatment regimen.  Please see AVS handed out to patient at the end of our visit for further patient instructions/ counseling done pertaining to today's office visit.    Note: This document was prepared using Dragon voice recognition software and may include unintentional dictation  errors.  ----------------------------------------------------------------------------------------------------------------------    Subjective:    Chief Complaint  Patient presents with  . Establish Care  . Thyroid Problem    HPI: [name] is a pleasant 60 y.o. female who presents to [place] [facility] today to review their medical history with me and establish care.    Dr Alroy DustSadie Haber on [address] Was her old PCP.  Didn't seem to listen to pt- S5 she is looking for a new physician.  Patient is a retired Designer, industrial/product. She has her MS and education. She is married to Milner and they have 2 cats. No history of any pregnancies.  30+ years of smoking at a quarter pack per day and quit in July 2017.    She tries to exercise daily 30-60 minutes of walking, swimming or bike riding.  She is adopted and does not know her family history.     Wt Readings from Last 3 Encounters:  06/04/16 171 lb 3.2 oz (77.7 kg)  01/15/14 160 lb (72.6 kg)  07/17/13 160 lb (72.6 kg)   BP Readings from Last 3 Encounters:  06/04/16 107/74  02/21/16 108/74  08/09/14 127/90   Pulse Readings from Last 3 Encounters:  06/04/16 76  02/21/16 78  08/09/14 76     Patient Active Problem List   Diagnosis Date Noted  . Hypothyroidism 06/04/2016  . Nail fungus 06/04/2016  . History of L hip replacement 06/04/2016  . Insomnia 06/04/2016  . Feeling exhausted 06/04/2016  . Muscle fatigue 06/04/2016  . Ex-cigarette smoker 06/04/2016  . Hepatitis C infection 06/04/2016     Past Medical History:  Diagnosis Date  . Arthritis   . Blood transfusion    1968  . Blood transfusion without reported diagnosis   . Cancer ([place])    cervical  . Complication of anesthesia    sensitive to all meds- takes longer to metabolize  . 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 Hx of BLACK WIDOW SPIDER BITE x 2 left shoulder 10/11 with  "knot at base of  scull"   seen by PCP- no neuro visit- resolved  . 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 Hypoglycemia   . Hypothyroidism      Past Surgical History:  Procedure Laterality Date  . CERVICAL CONE BIOPSY    . COLONOSCOPY    . EYE SURGERY     Lasic- left eye  . TONSILLECTOMY    . TOTAL HIP ARTHROPLASTY  10/30/2011   Procedure: TOTAL HIP ARTHROPLASTY;  Surgeon: Magnus Sinning, MD;  Location: [facility];  Service: Orthopedics;  Laterality: Left;     Family History  Problem Relation Age of Onset  .  Adopted: Yes     History  Drug Use No    Comment: marijuana, cocaine in college    History  Alcohol Use  . Yes    Comment: 1-2/ monthly    History  Smoking Status  . Former Smoker  . Packs/day: 0.50  . Years: 38.00  . Types: Cigarettes  . Quit date: 03/24/2016  Smokeless Tobacco  . Never Used    Comment: 5-6 cigarettes day    Patient's Medications  New Prescriptions   No medications on file  Previous Medications   PROBIOTIC PRODUCT (PROBIOTIC FORMULA PO)    Take 1 capsule by mouth daily.   SYNTHROID 100 MCG TABLET    Take 1 tablet by mouth daily.   TERBINAFINE (LAMISIL) 250 MG TABLET    Take 1 tablet (250 mg total) by mouth daily.  Modified Medications   No medications on file  Discontinued Medications   HOMEOPATHIC PRODUCTS (ARNICA [place] PO)    Place 2 tablets under the tongue daily.   LEVOTHYROXINE (SYNTHROID, LEVOTHROID) 75 MCG TABLET    Take 37.5 mcg by mouth daily before breakfast.    RIVAROXABAN (XARELTO) 10 MG TABS TABLET    Take 1 tablet (10 mg total) by mouth daily with breakfast.   TAVABOROLE (KERYDIN) 5 % SOLN    Apply 1 drop each affected nail once daily for 12 months    Allergies: Review of patient's allergies indicates no known allergies.  Review of Systems:   ( Completed via Adult Medical History Intake form today ) General:  Denies fever, chills, appetite changes, unexplained weight loss.  Optho/Auditory:   Denies visual changes, blurred vision/LOV,  ringing in ears/ diff hearing Respiratory:   Denies SOB, DOE, cough, wheezing.  Cardiovascular:   Denies chest pain, palpitations, new onset peripheral edema  Gastrointestinal:   Denies nausea, vomiting, diarrhea.  Genitourinary:    Denies dysuria, increased frequency, flank pain.  Endocrine:     Denies hot or cold intolerance, polyuria, polydipsia. Musculoskeletal:  Denies unexplained myalgias, joint swelling, arthralgias, gait problems.  Skin:  Denies rash, suspicious lesions or new/ changes in moles Neurological:    Denies dizziness, syncope, unexplained weakness, lightheadedness, numbness  Psychiatric/Behavioral:   Denies mood changes, suicidal or homicidal ideations, hallucinations    Objective:   Blood pressure 107/74, pulse 76, height 5' 8.25" (1.734 m), weight 171 lb 3.2 oz (77.7 kg), last menstrual period 10/29/2000. Body mass index is 25.84 kg/m.   General: Well Developed, well nourished, and in no acute distress.  Neuro: Alert and oriented x3, extra-ocular muscles intact, sensation grossly intact.  HEENT: Normocephalic, atraumatic, pupils equal round reactive to light, neck supple, no gross masses, no carotid bruits, no JVD apprec Skin: no gross suspicious lesions or rashes  Cardiac: Regular rate and rhythm, no murmurs rubs or gallops.  Respiratory: Essentially clear to auscultation bilaterally. Not using accessory muscles, speaking in full sentences.  Abdominal: Soft, not grossly distended Musculoskeletal: Ambulates w/o diff, FROM * 4 ext.  Vasc: less 2 sec cap RF, warm and pink  Psych:  No HI/SI, judgement and insight good, Euthymic mood. Full Affect.

## 2016-06-06 ENCOUNTER — Other Ambulatory Visit (INDEPENDENT_AMBULATORY_CARE_PROVIDER_SITE_OTHER): Payer: Managed Care, Other (non HMO)

## 2016-06-06 DIAGNOSIS — E038 Other specified hypothyroidism: Secondary | ICD-10-CM

## 2016-06-06 DIAGNOSIS — R5382 Chronic fatigue, unspecified: Secondary | ICD-10-CM

## 2016-06-06 DIAGNOSIS — M6289 Other specified disorders of muscle: Secondary | ICD-10-CM

## 2016-06-07 LAB — COMPREHENSIVE METABOLIC PANEL
ALK PHOS: 56 U/L (ref 33–130)
ALT: 16 U/L (ref 6–29)
AST: 17 U/L (ref 10–35)
Albumin: 4.5 g/dL (ref 3.6–5.1)
BILIRUBIN TOTAL: 0.6 mg/dL (ref 0.2–1.2)
BUN: 14 mg/dL (ref 7–25)
CALCIUM: 10.1 mg/dL (ref 8.6–10.4)
CO2: 26 mmol/L (ref 20–31)
Chloride: 104 mmol/L (ref 98–110)
Creat: 0.82 mg/dL (ref 0.50–1.05)
Glucose, Bld: 89 mg/dL (ref 65–99)
POTASSIUM: 5 mmol/L (ref 3.5–5.3)
Sodium: 141 mmol/L (ref 135–146)
TOTAL PROTEIN: 7.8 g/dL (ref 6.1–8.1)

## 2016-06-07 LAB — HEMOGLOBIN A1C
HEMOGLOBIN A1C: 5.4 % (ref <5.7)
Mean Plasma Glucose: 108 mg/dL

## 2016-06-07 LAB — CBC WITH DIFFERENTIAL/PLATELET
BASOS PCT: 1 %
Basophils Absolute: 67 cells/uL (ref 0–200)
EOS ABS: 201 {cells}/uL (ref 15–500)
Eosinophils Relative: 3 %
HEMATOCRIT: 46.9 % — AB (ref 35.0–45.0)
Hemoglobin: 15.8 g/dL — ABNORMAL HIGH (ref 11.7–15.5)
LYMPHS ABS: 2814 {cells}/uL (ref 850–3900)
Lymphocytes Relative: 42 %
MCH: 30.6 pg (ref 27.0–33.0)
MCHC: 33.7 g/dL (ref 32.0–36.0)
MCV: 90.9 fL (ref 80.0–100.0)
MONO ABS: 603 {cells}/uL (ref 200–950)
MPV: 11.6 fL (ref 7.5–12.5)
Monocytes Relative: 9 %
NEUTROS ABS: 3015 {cells}/uL (ref 1500–7800)
Neutrophils Relative %: 45 %
Platelets: 292 10*3/uL (ref 140–400)
RBC: 5.16 MIL/uL — AB (ref 3.80–5.10)
RDW: 13.5 % (ref 11.0–15.0)
WBC: 6.7 10*3/uL (ref 3.8–10.8)

## 2016-06-07 LAB — PHOSPHORUS: PHOSPHORUS: 4 mg/dL (ref 2.5–4.5)

## 2016-06-07 LAB — T4, FREE: FREE T4: 1.1 ng/dL (ref 0.8–1.8)

## 2016-06-07 LAB — LIPID PANEL
Cholesterol: 228 mg/dL — ABNORMAL HIGH (ref 125–200)
HDL: 49 mg/dL (ref >=46)
LDL CALC: 143 mg/dL — AB (ref <130)
TRIGLYCERIDES: 178 mg/dL — AB (ref <150)
Total CHOL/HDL Ratio: 4.7 Ratio (ref <=5.0)
VLDL: 36 mg/dL — AB (ref <30)

## 2016-06-07 LAB — MAGNESIUM: Magnesium: 2.1 mg/dL (ref 1.5–2.5)

## 2016-06-07 LAB — TSH: TSH: 9.5 mIU/L — ABNORMAL HIGH

## 2016-06-07 LAB — VITAMIN B12: VITAMIN B 12: 434 pg/mL (ref 200–1100)

## 2016-06-07 LAB — VITAMIN D 25 HYDROXY (VIT D DEFICIENCY, FRACTURES): VIT D 25 HYDROXY: 18 ng/mL — AB (ref 30–100)

## 2016-06-12 ENCOUNTER — Other Ambulatory Visit: Payer: Self-pay

## 2016-06-12 ENCOUNTER — Ambulatory Visit: Payer: Managed Care, Other (non HMO) | Admitting: Sports Medicine

## 2016-06-12 MED ORDER — VITAMIN D (ERGOCALCIFEROL) 1.25 MG (50000 UNIT) PO CAPS: 50000.0000 [IU] | ORAL_CAPSULE | ORAL | 3 refills | Status: DC

## 2016-06-22 ENCOUNTER — Telehealth: Payer: Self-pay

## 2016-06-22 NOTE — Telephone Encounter (Signed)
Pt c/o "terrible" cough x 5 days.  Worse when she talks.  Has not tried any OTC cough meds, but has taken Children's Mucinex, has been resting and drinking more water but these measures are not helping.  Is ok to have pt try OTC Robitussin DM or Delsym?  Any other advice?

## 2016-06-26 ENCOUNTER — Ambulatory Visit
Admission: RE | Admit: 2016-06-26 | Discharge: 2016-06-26 | Disposition: A | Discharge status: Home / Self Care | Payer: Managed Care, Other (non HMO) | Source: Ambulatory Visit | Attending: Family Medicine | Admitting: Family Medicine

## 2016-06-26 ENCOUNTER — Ambulatory Visit (INDEPENDENT_AMBULATORY_CARE_PROVIDER_SITE_OTHER): Payer: Managed Care, Other (non HMO) | Admitting: Family Medicine

## 2016-06-26 ENCOUNTER — Encounter: Payer: Self-pay | Admitting: Family Medicine

## 2016-06-26 ENCOUNTER — Encounter: Payer: Self-pay | Admitting: Sports Medicine

## 2016-06-26 ENCOUNTER — Ambulatory Visit (INDEPENDENT_AMBULATORY_CARE_PROVIDER_SITE_OTHER): Payer: Managed Care, Other (non HMO) | Admitting: Sports Medicine

## 2016-06-26 VITALS — BP 107/71 | HR 65 | Temp 98.0°F | Ht 68.25 in | Wt 169.6 lb

## 2016-06-26 DIAGNOSIS — Z9119 Patient's noncompliance with other medical treatment and regimen: Secondary | ICD-10-CM

## 2016-06-26 DIAGNOSIS — M79675 Pain in left toe(s): Secondary | ICD-10-CM

## 2016-06-26 DIAGNOSIS — R058 Other specified cough: Secondary | ICD-10-CM

## 2016-06-26 DIAGNOSIS — R05 Cough: Secondary | ICD-10-CM | POA: Diagnosis not present

## 2016-06-26 DIAGNOSIS — E781 Pure hyperglyceridemia: Secondary | ICD-10-CM

## 2016-06-26 DIAGNOSIS — E782 Mixed hyperlipidemia: Secondary | ICD-10-CM

## 2016-06-26 DIAGNOSIS — E038 Other specified hypothyroidism: Secondary | ICD-10-CM | POA: Diagnosis not present

## 2016-06-26 DIAGNOSIS — R946 Abnormal results of thyroid function studies: Secondary | ICD-10-CM

## 2016-06-26 DIAGNOSIS — J189 Pneumonia, unspecified organism: Secondary | ICD-10-CM

## 2016-06-26 DIAGNOSIS — Z79899 Other long term (current) drug therapy: Secondary | ICD-10-CM | POA: Diagnosis not present

## 2016-06-26 DIAGNOSIS — B351 Tinea unguium: Secondary | ICD-10-CM

## 2016-06-26 DIAGNOSIS — R062 Wheezing: Secondary | ICD-10-CM | POA: Diagnosis not present

## 2016-06-26 DIAGNOSIS — R7989 Other specified abnormal findings of blood chemistry: Secondary | ICD-10-CM

## 2016-06-26 DIAGNOSIS — Z91199 Patient's noncompliance with other medical treatment and regimen due to unspecified reason: Secondary | ICD-10-CM

## 2016-06-26 DIAGNOSIS — R0781 Pleurodynia: Secondary | ICD-10-CM

## 2016-06-26 DIAGNOSIS — E559 Vitamin D deficiency, unspecified: Secondary | ICD-10-CM

## 2016-06-26 DIAGNOSIS — Z87891 Personal history of nicotine dependence: Secondary | ICD-10-CM

## 2016-06-26 MED ORDER — PREDNISONE 20 MG PO TABS: ORAL_TABLET | ORAL | 0 refills | Status: DC

## 2016-06-26 MED ORDER — ALBUTEROL SULFATE HFA 108 (90 BASE) MCG/ACT IN AERS: 1.0000 | INHALATION_SPRAY | Freq: Four times a day (QID) | RESPIRATORY_TRACT | 1 refills | Status: DC | PRN

## 2016-06-26 MED ORDER — METHYLPREDNISOLONE ACETATE 80 MG/ML IJ SUSP
80.0000 mg | Freq: Once | INTRAMUSCULAR | Status: AC
  Administered 2016-06-26: 80 mg via INTRAMUSCULAR

## 2016-06-26 MED ORDER — MOXIFLOXACIN HCL 400 MG PO TABS: 400.0000 mg | ORAL_TABLET | Freq: Every day | ORAL | 0 refills | Status: AC

## 2016-06-26 MED ORDER — HYDROCOD POLST-CPM POLST ER 10-8 MG/5ML PO SUER: 5.0000 mL | Freq: Two times a day (BID) | ORAL | 0 refills | Status: DC | PRN

## 2016-06-26 NOTE — Progress Notes (Signed)
Assessment and plan:  1. Community acquired pneumonia, unspecified laterality   2. Pleuritic chest pain   3. Wheezing   4. Productive cough   5. Ex-cigarette smoker   6. Hypercholesterolemia with hypertriglyceridemia   7. Hypertriglyceridemia   8. Mixed hyperlipidemia   9. Vitamin D deficiency   10. Other specified hypothyroidism   11. Elevated TSH   12. Personal history of noncompliance with medical treatment and regimen     CAP- significant wheeze,  productive cough, F/C, pleuritic chest pain * 12 d :  - Chest x-ray ordered- explained to patient chest x-ray findings can lag behind clinical illness, but we will obtain - Antibiotics, cough medicine, steroids, Albuterol Inhaler - red flag symptoms discussed - Because this significant wheezing- steroid injection in office today followed by tapering dose. - Meds per below-  risks associated with each discussed and told to use albuterol only when necessary.   - Supportive care and various OTC medications discussed in addition to any prescribed. - Call or RTC if new symptoms, or if no improvement or worse over next couple days.     Hypercholesterolemia and hypertriglyceridemia:  Pt declines meds for cholesterol; she understands she is at a greater than 6% over the next 10 years to have her first heart attack or stroke. Discussion about this had   Extensive health educational discussion regarding diet and exercise modifications.   handouts provided.     She agrees to recheck cholesterol 4 months   Hypothyroidism- with elevated TSH likely causing patient's significant fatigue:  Patient admits to not taking her Synthroid medications regularly.  Strict medication compliance and recheck labs needed in 6-8 weeks.   Vitamin D def:  Patient understands to take her weekly as well as daily doses of vitamin D; also advised to take at least 1200 mg calcium daily.   Medical noncompliance:  Discussed importance at her age of becoming  more serious about her health and following through with medications, appointments, and treatment plans.     Pt was in the office today for 40+ minutes, with over 50% time spent in face to face counseling of various medical concerns and in coordination of care Orders Placed This Encounter  Procedures  . DG Chest 2 View  . T3, free  . T4, free  . TSH    New Prescriptions   ALBUTEROL (PROVENTIL HFA;VENTOLIN HFA) 108 (90 BASE) MCG/ACT INHALER    Inhale 1-2 puffs into the lungs every 6 (six) hours as needed for wheezing.   CHLORPHENIRAMINE-HYDROCODONE (TUSSIONEX) 10-8 MG/5ML SUER    Take 5 mLs by mouth every 12 (twelve) hours as needed for cough (cough, will cause drowsiness.).   MOXIFLOXACIN (AVELOX) 400 MG TABLET    Take 1 tablet (400 mg total) by mouth daily.   PREDNISONE (DELTASONE) 20 MG TABLET    Take 3 pills a day for 2 days, 2 pills a day for 2 days, 1 pill a day for 2 days then one half pill a day for 2 days then off    Modified Medications   No medications on file    Discontinued Medications   No medications on file    Anticipatory guidance and routine counseling done re: condition, txmnt options and need for follow up. All questions of patient's were answered.   Gross side effects, risk and benefits, and alternatives of medications discussed with patient.  Patient is aware that all medications have potential side effects and we are unable to predict every sideeffect  or drug-drug interaction that may occur.  Expresses verbal understanding and consents to current therapy plan and treatment regiment.  Return if symptoms worsen or fail to improve in couple days- we need to know, for f/up 60month with me; but bldwrk near future.  Please see AVS handed out to patient at the end of our visit for additional patient instructions/ counseling done pertaining to today's office visit.  Note: This document was prepared using Dragon voice recognition software and may include unintentional  dictation errors.    Subjective:    CC: ACUTE CARE 15 min OV- URI Sx  HPI:  Pt presents with URI sx for 12 days.      C/o head congestion,  ++PND and prod cough- so hard for so long it hurts- R lower lung region now for 3 days or so. Exhausted, SOB, Wheezing esp lying down. Difficulty to breathe laying down    Quit smoking 7/17, but still sneaks them from time to time.  States none in past 1 month.    Having chills and occasionally sweats-but thought it was just menopausal sx  No face pain or ear pain, No N/V/D, No Rash.     Only tried -Children's mucinex taken anything for sx.      Overall Sx getting W.    But ALSO:  Patient had fasting blood work done on 91\82\99    She was asked to make a follow-up office visit to discuss this and she never did.     Pt asked me about them today as she is still having excessive fatigue, anhedonia, tired all the time.    I told her we did not have time to discuss that during our acute sick visit but she insisted.   ---> All recent labs reviewed in person and all questions were answered; especially since there were significant abnormalities that needed to be addressed   Patient Active Problem List   Diagnosis Date Noted  . Vitamin D deficiency 07/01/2016  . Hypercholesterolemia with hypertriglyceridemia 06/26/2016  . Hypothyroidism 06/04/2016  . Nail fungus 06/04/2016  . History of L hip replacement 06/04/2016  . Insomnia 06/04/2016  . Feeling exhausted 06/04/2016  . Muscle fatigue 06/04/2016  . Ex-cigarette smoker 06/04/2016  . Hepatitis C infection 06/04/2016    Past medical history, Surgical history, Family history reviewed and noted below, Social history, Allergies, and Medications have been entered into the medical record, reviewed and changed as needed.   No Known Allergies  Review of Systems  Constitutional: Positive for chills, fever and malaise/fatigue. Negative for diaphoresis and weight loss.  HENT: Positive for  congestion. Negative for sore throat and tinnitus.   Eyes: Negative.  Negative for blurred vision, double vision and photophobia.  Respiratory: Positive for cough, sputum production, shortness of breath and wheezing.   Cardiovascular: Positive for chest pain. Negative for palpitations.  Gastrointestinal: Negative.  Negative for blood in stool, diarrhea, nausea and vomiting.  Genitourinary: Negative.  Negative for dysuria, frequency and urgency.  Musculoskeletal: Positive for myalgias. Negative for joint pain.       Generalized  Skin: Negative.  Negative for itching and rash.  Neurological: Positive for weakness. Negative for dizziness, focal weakness and headaches.  Endo/Heme/Allergies: Negative.  Negative for environmental allergies and polydipsia. Does not bruise/bleed easily.  Psychiatric/Behavioral: Negative.  Negative for depression.    Objective:   Blood pressure 107/71, pulse 65, temperature 98 F (36.7 C), temperature source Oral, height 5' 8.25" (1.734 m), weight 169 lb 9.6 oz (76.9  kg), last menstrual period 10/29/2000, SpO2 91 %. Body mass index is 25.6 kg/m.  General: Well Developed, well nourished, appropriate for stated age.  Neuro: Alert and oriented x3, extra-ocular muscles intact, sensation grossly intact.  HEENT: Normocephalic, atraumatic, pupils equal round reactive to light, neck supple, no masses, not painful, but + ant cervical lymphadenopathy, TM's intact B/L, no acute findings. Nares- patent, clear d/c, OP- clear, erythema, No TTP sinuses Skin: Warm and dry, no gross rash. Cardiac: RRR but distant, S1 S2,  no murmurs rubs or gallops.  Respiratory: B/L and A/P expiratory wheezes throughout. Rhonchi throughout but increased at bases- R>erL, Not using accessory muscles, speaking in full sentences- unlabored. Vascular:  No gross lower ext edema, cap RF less 2 sec. Psych: No HI/SI, judgement and insight good, Euthymic mood. Full Affect.   Recent Results (from the past  2160 hour(s))  CBC with Differential/Platelet     Status: Abnormal   Collection Time: 06/06/16  8:50 AM  Result Value Ref Range   WBC 6.7 3.8 - 10.8 K/uL   RBC 5.16 (H) 3.80 - 5.10 MIL/uL   Hemoglobin 15.8 (H) 11.7 - 15.5 g/dL   HCT 46.9 (H) 35.0 - 45.0 %   MCV 90.9 80.0 - 100.0 fL   MCH 30.6 27.0 - 33.0 pg   MCHC 33.7 32.0 - 36.0 g/dL   RDW 13.5 11.0 - 15.0 %   Platelets 292 140 - 400 K/uL   MPV 11.6 7.5 - 12.5 fL   Neutro Abs 3,015 1,500 - 7,800 cells/uL   Lymphs Abs 2,814 850 - 3,900 cells/uL   Monocytes Absolute 603 200 - 950 cells/uL   Eosinophils Absolute 201 15 - 500 cells/uL   Basophils Absolute 67 0 - 200 cells/uL   Neutrophils Relative % 45 %   Lymphocytes Relative 42 %   Monocytes Relative 9 %   Eosinophils Relative 3 %   Basophils Relative 1 %   Smear Review Criteria for review not met   Comprehensive metabolic panel     Status: None   Collection Time: 06/06/16  8:50 AM  Result Value Ref Range   Sodium 141 135 - 146 mmol/L   Potassium 5.0 3.5 - 5.3 mmol/L   Chloride 104 98 - 110 mmol/L   CO2 26 20 - 31 mmol/L   Glucose, Bld 89 65 - 99 mg/dL   BUN 14 7 - 25 mg/dL   Creat 0.82 0.50 - 1.05 mg/dL    Comment:   For patients > or = 59 years of age: The upper reference limit for Creatinine is approximately 13% higher for people identified as African-American.      Total Bilirubin 0.6 0.2 - 1.2 mg/dL   Alkaline Phosphatase 56 33 - 130 U/L   AST 17 10 - 35 U/L   ALT 16 6 - 29 U/L   Total Protein 7.8 6.1 - 8.1 g/dL   Albumin 4.5 3.6 - 5.1 g/dL   Calcium 10.1 8.6 - 10.4 mg/dL  Hemoglobin A1c     Status: None   Collection Time: 06/06/16  8:50 AM  Result Value Ref Range   Hgb A1c MFr Bld 5.4 <5.7 %    Comment:   For the purpose of screening for the presence of diabetes:   <5.7%       Consistent with the absence of diabetes 5.7-6.4 %   Consistent with increased risk for diabetes (prediabetes) >=6.5 %     Consistent with diabetes   This assay result  is  consistent with a decreased risk of diabetes.   Currently, no consensus exists regarding use of hemoglobin A1c for diagnosis of diabetes in children.   According to American Diabetes Association (ADA) guidelines, hemoglobin A1c <7.0% represents optimal control in non-pregnant diabetic patients. Different metrics may apply to specific patient populations. Standards of Medical Care in Diabetes (ADA).      Mean Plasma Glucose 108 mg/dL  Lipid panel     Status: Abnormal   Collection Time: 06/06/16  8:50 AM  Result Value Ref Range   Cholesterol 228 (H) 125 - 200 mg/dL   Triglycerides 178 (H) <150 mg/dL   HDL 49 >=46 mg/dL   Total CHOL/HDL Ratio 4.7 <=5.0 Ratio   VLDL 36 (H) <30 mg/dL   LDL Cholesterol 143 (H) <130 mg/dL    Comment:   Total Cholesterol/HDL Ratio:CHD Risk                        Coronary Heart Disease Risk Table                                        Men       Women          1/2 Average Risk              3.4        3.3              Average Risk              5.0        4.4           2X Average Risk              9.6        7.1           3X Average Risk             23.4       11.0 Use the calculated Patient Ratio above and the CHD Risk table  to determine the patient's CHD Risk.   TSH     Status: Abnormal   Collection Time: 06/06/16  8:50 AM  Result Value Ref Range   TSH 9.50 (H) mIU/L    Comment:   Reference Range   > or = 20 Years  0.40-4.50   Pregnancy Range First trimester  0.26-2.66 Second trimester 0.55-2.73 Third trimester  0.43-2.91     Vitamin B12     Status: None   Collection Time: 06/06/16  8:50 AM  Result Value Ref Range   Vitamin B-12 434 200 - 1,100 pg/mL  VITAMIN D 25 Hydroxy (Vit-D Deficiency, Fractures)     Status: Abnormal   Collection Time: 06/06/16  8:50 AM  Result Value Ref Range   Vit D, 25-Hydroxy 18 (L) 30 - 100 ng/mL    Comment: Vitamin D Status           25-OH Vitamin D        Deficiency                <20 ng/mL         Insufficiency         20 - 29 ng/mL        Optimal             > or =  30 ng/mL   For 25-OH Vitamin D testing on patients on D2-supplementation and patients for whom quantitation of D2 and D3 fractions is required, the [facility] 25-OH VIT D, (D2,D3), LC/MS/MS is recommended: order code [id number] (patients > 2 yrs).   Magnesium     Status: None   Collection Time: 06/06/16  8:50 AM  Result Value Ref Range   Magnesium 2.1 1.5 - 2.5 mg/dL  Phosphorus     Status: None   Collection Time: 06/06/16  8:50 AM  Result Value Ref Range   Phosphorus 4.0 2.5 - 4.5 mg/dL  T4, free     Status: None   Collection Time: 06/06/16  8:50 AM  Result Value Ref Range   Free T4 1.1 0.8 - 1.8 ng/dL

## 2016-06-26 NOTE — Patient Instructions (Signed)

## 2016-06-26 NOTE — Progress Notes (Signed)
Patient ID: [name], female   DOB: [birth year], 60 y.o.   MRN: [id number] Subjective: Mario Voong is a 60 y.o. female patient seen today in office for follow up eval after starting 03-23-16 Lamisil for nail fungus. Reports no issues and currently done with medication and using Formula 3 as bought from office with no issues. Patient has no other pedal complaints at this time.   Patient Active Problem List   Diagnosis Date Noted  . Hypercholesterolemia with hypertriglyceridemia 06/26/2016  . Hypothyroidism 06/04/2016  . Nail fungus 06/04/2016  . History of L hip replacement 06/04/2016  . Insomnia 06/04/2016  . Feeling exhausted 06/04/2016  . Muscle fatigue 06/04/2016  . Ex-cigarette smoker 06/04/2016  . Hepatitis C infection 06/04/2016    Current Outpatient Prescriptions on File Prior to Visit  Medication Sig Dispense Refill  . Probiotic Product (PROBIOTIC FORMULA PO) Take 1 capsule by mouth daily.    Marland Kitchen SYNTHROID 100 MCG tablet Take 1 tablet by mouth daily.    Marland Kitchen terbinafine (LAMISIL) 250 MG tablet Take 1 tablet (250 mg total) by mouth daily. 30 tablet 2  . Vitamin D, Ergocalciferol, (DRISDOL) 50000 units CAPS capsule Take 1 capsule (50,000 Units total) by mouth every 7 (seven) days. 12 capsule 3   No current facility-administered medications on file prior to visit.     No Known Allergies  Objective: Physical Exam  General: Well developed, nourished, no acute distress, awake, alert and oriented x 3  Vascular: Dorsalis pedis artery 2/4 bilateral, Posterior tibial artery 2/4 bilateral, skin temperature warm to warm proximal to distal bilateral lower extremities, no varicosities, pedal hair present bilateral.  Neurological: Gross sensation present via light touch bilateral.   Dermatological: Skin is warm, dry, and supple bilateral, Nails 1 and 3 on left are tender, short thick, and discolored with mild subungal debris and proximal clearing, all other nails within normal  limits, no webspace macerations present bilateral, no open lesions present bilateral, mild sub met 2 on left and medial hallux on right hyperkeratotic tissue present bilateral. Complimentary trimmed these areas for patient today without incident. No signs of infection bilateral. Tinea resolved.    Musculoskeletal: No boney deformities noted bilateral. Muscular strength within normal limits without painon range of motion. No pain with calf compression bilateral.  Fungal culture + T. 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  Landis Martins, DPM

## 2016-06-26 NOTE — Patient Instructions (Signed)
Please exercise 44min mod intensity exercise daily and since you decline meds for chol - let's reck in 29months    Guidelines for a Low Cholesterol, Low Saturated Fat Diet   Fats - Limit total intake of fats and oils. - Avoid butter, stick margarine, shortening, lard, palm and coconut oils. - Limit mayonnaise, salad dressings, gravies and sauces, unless they are homemade with low-fat ingredients. - Limit chocolate. - Choose low-fat and nonfat products, such as low-fat mayonnaise, low-fat or non-hydrogenated peanut butter, low-fat or fat-free salad dressings and nonfat gravy. - Use vegetable oil, such as canola or olive oil. - Look for margarine that does not contain trans fatty acids. - Use nuts in moderate amounts. - Read ingredient labels carefully to determine both amount and type of fat present in foods. Limit saturated and trans fats! - Avoid high-fat processed and convenience foods.  Meats and Meat Alternatives - Choose fish, chicken, [place] and lean meats. - Use dried beans, peas, lentils and tofu. - Limit egg yolks to three to four per week. - If you eat red meat, limit to no more than three servings per week and choose loin or round cuts. - Avoid fatty meats, such as bacon, sausage, franks, luncheon meats and ribs. - Avoid all organ meats, including liver.  Dairy - Choose nonfat or low-fat milk, yogurt and cottage cheese. - Most cheeses are high in fat. Choose cheeses made from non-fat milk, such as mozzarella and ricotta cheese. - Choose light or fat-free cream cheese and sour cream. - Avoid cream and sauces made with cream.  Fruits and Vegetables - Eat a wide variety of fruits and vegetables. - Use lemon juice, vinegar or "mist" olive oil on vegetables. - Avoid adding sauces, fat or oil to vegetables.  Breads, Cereals and Grains - Choose whole-grain breads, cereals, pastas and rice. - Avoid high-fat snack foods, such as granola, cookies, pies, pastries, doughnuts and  croissants.  Cooking Tips - Avoid deep fried foods. - Trim visible fat off meats and remove skin from poultry before cooking. - Bake, broil, boil, poach or roast poultry, fish and lean meats. - Drain and discard fat that drains out of meat as you cook it. - Add little or no fat to foods. - Use vegetable oil sprays to grease pans for cooking or baking. - Steam vegetables. - Use herbs or no-oil marinades to flavor foods.         For those diagnosed with high triglycerides, it's important to take action to lower your levels and improve your heart health.  Triglyceride is just a fancy word for fat - the fat in our bodies is stored in the form of triglycerides. Triglycerides are found in foods and manufactured in our bodies.  Normal triglyceride levels are defined as less than 150 mg/dL; 150 to 199 is considered borderline high; 200 to 499 is high; and 500 or higher is officially called very high. To me, anything over 150 is a red flag indicating my patient needs to take immediate steps to get the situation under control.   What is the significance of high triglycerides? High triglyceride levels make blood thicker and stickier, which means that it is more likely to form clots. Studies have shown that triglyceride levels are associated with increased risks of cardiovascular disease and stroke - in both men and women - alone or in combination with other risk factors (high triglycerides combined with high LDL cholesterol can be a particularly deadly combination). For example, in one  ground-breaking study, high triglycerides alone increased the risk of cardiovascular disease by 14 percent in men, and by 25 percent in women. But when the test subjects also had low HDL cholesterol (that's the good cholesterol) and other risk factors, high triglycerides increased the risk of disease by 32 percent in men and 76 percent in women.   Fortunately, triglycerides can sometimes be controlled with several diet  and lifestyle changes.    What Factors Can Increase Triglycerides? As with cholesterol, eating too much of the wrong kinds of fats will raise your blood triglycerides.  Therefore, it's important to restrict the amounts of saturated fats and trans fats you allow into your diet.  Triglyceride levels can also shoot up after eating foods that are high in carbohydrates or after drinking alcohol.  That's why triglyceride blood tests require an overnight fast.  If you have elevated triglycerides, it's especially important to avoid sugary and refined carbohydrates, including sugar, honey, and other sweeteners, soda and other sugary drinks, candy, baked goods, and anything made with white (refined or enriched) flour, including white bread, rolls, cereals, buns, pastries, regular pasta, and white rice.  You'll also want to limit dried fruit and fruit juice since they're dense in simple sugar.  All of these low-quality carbs cause a sudden rise in insulin, which may lead to a spike in triglycerides.  Triglycerides can also become elevated as a reaction to having diabetes, hypothyroidism, or kidney disease. As with most other heart-related factors, being overweight and inactive also contribute to abnormal triglycerides. And unfortunately, some people have a genetic predisposition that causes them to manufacture way too much triglycerides on their own, no matter how carefully they eat.   How Can You Lower Your Triglyceride Levels? If you are diagnosed with high triglycerides, it's important to take action. There are several things you can do to help lower your triglyceride levels and improve your heart health:  Lose weight if you are overweight.  There is a clear correlation between obesity and high triglycerides - the heavier people are, the higher their triglyceride levels are likely to be. The good news is that losing weight can significantly lower triglycerides. In a large study of individuals with type 2  diabetes, those assigned to the "lifestyle intervention group" - which involved counseling, a low-calorie meal plan, and customized exercise program - lost 8.6% of their body weight and lowered their triglyceride levels by more than 16%. If you're overweight, find a weight loss plan that works for you and commit to shedding the pounds and getting healthier.  Reduce the amount of saturated fat and trans fat in your diet.  Start by avoiding or dramatically limiting butter, cream cheese, lard, sour cream, doughnuts, cakes, cookies, candy bars, regular ice cream, fried foods, pizza, cheese sauce, cream-based sauces and salad dressings, high-fat meats (including fatty hamburgers, bologna, pepperoni, sausage, bacon, salami, pastrami, spareribs, and hot dogs), high-fat cuts of beef and pork, and whole-milk dairy products.   Other ways to cut back: Choose lean meats only (including skinless chicken and [place], lean beef, lean pork), fish, and reduced-fat or fat-free dairy products.   Experiment with adding whole soy foods to your diet. Although soy itself may not reduce risk of heart disease, it replaces hazardous animal fats with healthier proteins. Choose high-quality soy foods, such as tofu, tempeh, soy milk, and edamame (whole soybeans).  Always remove skin from poultry.  Prepare foods by baking, roasting, broiling, boiling, poaching, steaming, grilling, or stir-frying in vegetable oil.  Most stick margarines contain trans fats, and trans fats are also found in some packaged baked goods, potato chips, snack foods, fried foods, and fast food that use or create hydrogenated oils.    (All food labels must now list the amount of trans fats, right after the amount of saturated fats - good news for consumers. As a result, many food companies have now reformulated their products to be trans fat free.many, but not all! So it's still just as important to read labels and make sure the packaged foods you buy don't  contain trans fats.)     If you use margarine, purchase soft-tub margarine spreads that contain 0 grams trans fats and don't list any partially hydrogenated oils in the ingredients list. By substituting olive oil or vegetable oil for trans fats in just 2 percent of your daily calories, you can reduce your risk of heart disease by 53 percent.   There is no safe amount of trans fats, so try to keep them as far from your plate as possible.  Avoid foods that are concentrated in sugar (even dried fruit and fruit juice). Sugary foods can elevate triglyceride levels in the blood, so keep them to a bare minimum.  Swap out refined carbohydrates for whole grains.  Refined carbohydrates - like white rice, regular pasta, and anything made with white or "enriched" flour (including white bread, rolls, cereals, buns, and crackers) - raise blood sugar and insulin levels more than fiber-rich whole grains. Higher insulin levels, in turn, can lead to a higher rise in triglycerides after a meal. So, make the switch to whole wheat bread, whole grain pasta, brown or wild rice, and whole grain versions of cereals, crackers, and other bread products. However, it's important to know that individuals with high triglycerides should moderate even their intake of high-quality starches (since all starches raise blood sugar) - I recommend 1 to 2 servings per meal.  Cut way back on alcohol.  If you have high triglycerides, alcohol should be considered a rare treat - if you indulge at all, since even small amounts of alcohol can dramatically increase triglyceride levels.  Incorporate omega-3 fats.  Heart-healthy fish oils are especially rich in omega-3 fatty acids. In multiple studies over the past two decades, people who ate diets high in omega-3s had 30 to 40 percent reductions in heart disease. Although we don't yet know why fish oil works so well, there are several possibilities. Omega-3s seem to reduce inflammation, reduce high  blood pressure, decrease triglycerides, raise HDL cholesterol, and make blood thinner and less sticky so it is less likely to clot. It's as close to a food prescription for heart health as it gets. If you have high triglycerides, I recommend eating at least three servings of one of the omega-3-rich fish every week (fatty fish is the most concentrated food form of omega three fats). If you cannot manage to eat that much fish, speak with your physician about taking fish oil capsules, which offer similar benefits.The best foods for omega-3 fatty acids include wild salmon (fresh, canned), herring, mackerel (not king), sardines, anchovies, rainbow trout, and Pacific oysters. Non-fish sources of omega-3 fats include omega-3-fortified eggs, ground flaxseed, chia seeds, walnuts, butternuts (white walnuts), seaweed, walnut oil, canola oil, and soybeans.  Quit smoking.  Smoking causes inflammation, not just in your lungs, but throughout your body. Inflammation can contribute to atherosclerosis, blood clots, and risk of heart attack. Smoking makes all heart health indicators worse. If you have high cholesterol,  high triglycerides, or high blood pressure, smoking magnifies the danger.  Become more physically active.  Even moderate exercise can help improve cholesterol, triglycerides, and blood pressure. Aerobic exercise seems to be able to stop the sharp rise of triglycerides after eating, perhaps because of a decrease in the amount of triglyceride released by the liver, or because active muscle clears triglycerides out of the blood stream more quickly than inactive muscle. If you haven't exercised regularly (or at all) for years, I recommend starting slowly, by walking at an easy pace for 15 minutes a day. Then, as you feel more comfortable, increase the amount. Your ultimate goal should be at least 30 minutes of moderate physical activity, at least five days a week.

## 2016-06-27 NOTE — Telephone Encounter (Signed)
Pt seen in office 06/26/2016.  Charyl Bigger, CMA

## 2016-07-01 DIAGNOSIS — E559 Vitamin D deficiency, unspecified: Secondary | ICD-10-CM | POA: Insufficient documentation

## 2016-07-11 ENCOUNTER — Other Ambulatory Visit (INDEPENDENT_AMBULATORY_CARE_PROVIDER_SITE_OTHER): Payer: Managed Care, Other (non HMO)

## 2016-07-11 DIAGNOSIS — E038 Other specified hypothyroidism: Secondary | ICD-10-CM

## 2016-07-12 LAB — T4, FREE: Free T4: 1.2 ng/dL (ref 0.8–1.8)

## 2016-07-12 LAB — TSH: TSH: 3.56 mIU/L

## 2016-07-12 LAB — T3, FREE: T3, Free: 2.5 pg/mL (ref 2.3–4.2)

## 2016-07-19 ENCOUNTER — Other Ambulatory Visit: Payer: Self-pay

## 2016-07-19 MED ORDER — SYNTHROID 100 MCG PO TABS: 100.0000 ug | ORAL_TABLET | Freq: Every day | ORAL | 1 refills | Status: DC

## 2016-07-19 NOTE — Telephone Encounter (Signed)
Pt called requesting refill on Synthroid.  Given her recent thyroid labs were normal, RX sent to [facility].  Pt advised that she is due for follow up appointment with our office.  Pt expressed understanding and was transferred to front desk to schedule appointment.  Charyl Bigger, CMA

## 2016-08-06 LAB — BASIC METABOLIC PANEL
BUN: 18 mg/dL (ref 4–21)
Creatinine: 0.8 mg/dL (ref <=1.1)
GLUCOSE: 88 mg/dL
POTASSIUM: 4.5 mmol/L (ref 3.4–5.3)
SODIUM: 138 mmol/L (ref 137–147)

## 2016-08-06 LAB — CBC AND DIFFERENTIAL
HCT: 47 % — AB (ref 36–46)
HEMOGLOBIN: 15.6 g/dL (ref 12.0–16.0)
Platelets: 290 10*3/uL (ref 150–399)
WBC: 7.4 10^3/mL

## 2016-08-06 LAB — HEPATIC FUNCTION PANEL
ALT: 18 U/L (ref 7–35)
AST: 16 U/L (ref 13–35)
Alkaline Phosphatase: 49 U/L (ref 25–125)
Bilirubin, Total: 0.5 mg/dL

## 2016-08-27 ENCOUNTER — Encounter: Payer: Managed Care, Other (non HMO) | Admitting: Family Medicine

## 2016-09-13 ENCOUNTER — Encounter: Payer: Self-pay | Admitting: Family Medicine

## 2016-11-15 ENCOUNTER — Encounter: Payer: Managed Care, Other (non HMO) | Admitting: Family Medicine

## 2016-11-22 LAB — CBC AND DIFFERENTIAL
HCT: 44 % (ref 36–46)
HEMOGLOBIN: 14.5 g/dL (ref 12.0–16.0)
PLATELETS: 279 10*3/uL (ref 150–399)
WBC: 7.1 10*3/mL

## 2016-11-22 LAB — HEPATIC FUNCTION PANEL
ALT: 16 U/L (ref 7–35)
AST: 18 U/L (ref 13–35)
Alkaline Phosphatase: 55 U/L (ref 25–125)
BILIRUBIN, TOTAL: 0.6 mg/dL

## 2016-11-22 LAB — HEPATITIS C ANTIBODY: HEP C AB: NEGATIVE

## 2016-11-22 LAB — BASIC METABOLIC PANEL
BUN: 18 mg/dL (ref 4–21)
Creatinine: 0.9 mg/dL (ref <=1.1)
Glucose: 96 mg/dL
Potassium: 4.8 mmol/L (ref 3.4–5.3)
Sodium: 140 mmol/L (ref 137–147)

## 2016-11-26 ENCOUNTER — Encounter: Payer: Managed Care, Other (non HMO) | Admitting: Family Medicine

## 2016-12-25 ENCOUNTER — Ambulatory Visit: Payer: Managed Care, Other (non HMO) | Admitting: Sports Medicine

## 2016-12-28 ENCOUNTER — Other Ambulatory Visit (INDEPENDENT_AMBULATORY_CARE_PROVIDER_SITE_OTHER): Payer: Commercial Managed Care - PPO

## 2016-12-28 DIAGNOSIS — E038 Other specified hypothyroidism: Secondary | ICD-10-CM | POA: Diagnosis not present

## 2016-12-28 DIAGNOSIS — E782 Mixed hyperlipidemia: Secondary | ICD-10-CM

## 2016-12-29 LAB — LIPID PANEL
CHOLESTEROL TOTAL: 184 mg/dL (ref 100–199)
Chol/HDL Ratio: 4.3 ratio (ref 0.0–4.4)
HDL: 43 mg/dL (ref >39)
LDL Calculated: 111 mg/dL — ABNORMAL HIGH (ref 0–99)
Triglycerides: 149 mg/dL (ref 0–149)
VLDL CHOLESTEROL CAL: 30 mg/dL (ref 5–40)

## 2016-12-29 LAB — TSH: TSH: 3.89 u[IU]/mL (ref 0.450–4.500)

## 2017-01-01 ENCOUNTER — Other Ambulatory Visit: Payer: Managed Care, Other (non HMO)

## 2017-01-07 ENCOUNTER — Encounter: Payer: Self-pay | Admitting: Adult Health

## 2017-01-07 ENCOUNTER — Other Ambulatory Visit (HOSPITAL_COMMUNITY)
Admission: RE | Admit: 2017-01-07 | Discharge: 2017-01-07 | Disposition: A | Discharge status: Home / Self Care | Payer: Commercial Managed Care - PPO | Source: Ambulatory Visit | Attending: Adult Health | Admitting: Adult Health

## 2017-01-07 ENCOUNTER — Ambulatory Visit (INDEPENDENT_AMBULATORY_CARE_PROVIDER_SITE_OTHER): Payer: Commercial Managed Care - PPO | Admitting: Adult Health

## 2017-01-07 ENCOUNTER — Encounter: Payer: Managed Care, Other (non HMO) | Admitting: Family Medicine

## 2017-01-07 VITALS — BP 104/69 | HR 80 | Ht 68.25 in | Wt 170.4 lb

## 2017-01-07 DIAGNOSIS — Z124 Encounter for screening for malignant neoplasm of cervix: Secondary | ICD-10-CM | POA: Insufficient documentation

## 2017-01-07 DIAGNOSIS — G4700 Insomnia, unspecified: Secondary | ICD-10-CM

## 2017-01-07 DIAGNOSIS — E559 Vitamin D deficiency, unspecified: Secondary | ICD-10-CM | POA: Diagnosis not present

## 2017-01-07 DIAGNOSIS — E038 Other specified hypothyroidism: Secondary | ICD-10-CM | POA: Diagnosis not present

## 2017-01-07 DIAGNOSIS — Z23 Encounter for immunization: Secondary | ICD-10-CM

## 2017-01-07 MED ORDER — VITAMIN D (ERGOCALCIFEROL) 1.25 MG (50000 UNIT) PO CAPS: 50000.0000 [IU] | ORAL_CAPSULE | ORAL | 0 refills | Status: DC

## 2017-01-07 MED ORDER — AZITHROMYCIN 250 MG PO TABS: ORAL_TABLET | ORAL | 0 refills | Status: DC

## 2017-01-07 NOTE — Addendum Note (Signed)
Addended by: Fonnie Mu on: 01/07/2017 03:10 PM   Modules accepted: Orders

## 2017-01-07 NOTE — Progress Notes (Signed)
Subjective:    Patient ID: [name], female    DOB: Jul 13, 1956, 61 y.o.   MRN: [id number]  HPI:  [name] presents for CPE today.  Only acute issue today-productive, persistent cough (thick white/yellow mucus) that started > 3 week ago.  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  She is compliant on daily Levothyroxine 173mcg and last lipid panel (tot 184, TG 149, LDL 111, HDL 43) and A1c (5.4) were drawn 12/28/16.  Patient Care Team    Relationship Specialty Notifications Start End  Mellody Dance, DO PCP - General Family Medicine  06/04/16   Magnus Sinning, MD (Inactive) Consulting Physician Orthopedic Surgery  06/04/16    Comment: L THR in Feb 2013  Paralee Cancel, MD Consulting Physician Orthopedic Surgery  06/04/16   Arta Silence, MD Consulting Physician Gastroenterology  06/04/16    Comment: Hep C txmnt  Druscilla Brownie, MD Consulting Physician Dermatology  06/04/16   Landis Martins, DPM Consulting Physician Podiatry  06/04/16     Patient Active Problem List   Diagnosis Date Noted  . Screening for cervical cancer 01/07/2017  . Vitamin D deficiency 07/01/2016  . Hypercholesterolemia with hypertriglyceridemia 06/26/2016  . Hypothyroidism 06/04/2016  . Nail fungus 06/04/2016  . History of L hip replacement 06/04/2016  . Insomnia 06/04/2016  . Feeling exhausted 06/04/2016  . Muscle fatigue 06/04/2016  . Ex-cigarette smoker 06/04/2016  . Hepatitis C infection 06/04/2016     Past Medical History:  Diagnosis Date  . Arthritis   . Blood transfusion    1968  . Blood transfusion without reported diagnosis   . Cancer ([place])    cervical  . Complication of anesthesia    sensitive to all meds- takes longer to metabolize  . 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 Hx of BLACK WIDOW SPIDER BITE x 2 left shoulder 10/11 with  "knot at base  of scull"   seen by PCP- no neuro visit- resolved  . 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 Hypoglycemia   . Hypothyroidism      Past Surgical History:  Procedure Laterality Date  . CERVICAL CONE BIOPSY    . COLONOSCOPY    . EYE SURGERY     Lasic- left eye  . TONSILLECTOMY    . TOTAL HIP ARTHROPLASTY  10/30/2011   Procedure: TOTAL HIP ARTHROPLASTY;  Surgeon: Magnus Sinning, MD;  Location: [facility];  Service: Orthopedics;  Laterality: Left;     Family History  Problem Relation Age of Onset  . Adopted: Yes     History  Drug Use No    Comment: marijuana, cocaine in college     History  Alcohol Use  . Yes    Comment: 1-2/ monthly     History  Smoking Status  . Former Smoker  . Packs/day: 0.50  . Years: 38.00  . Types: Cigarettes  . Quit date: 03/24/2016  Smokeless Tobacco  . Never Used    Comment: 5-6 cigarettes day     Outpatient Encounter Prescriptions as of 01/07/2017  Medication Sig  . albuterol (PROVENTIL HFA;VENTOLIN HFA) 108 (90 Base) MCG/ACT inhaler Inhale 1-2 puffs into the lungs every 6 (six) hours as needed for wheezing.  . Calcium 500 MG CHEW Chew 1 each by mouth at bedtime.  Marland Kitchen OVER THE COUNTER MEDICATION Take 1  tablet by mouth 2 (two) times daily.  . Probiotic Product (PROBIOTIC FORMULA PO) Take 1 capsule by mouth daily.  Marland Kitchen SYNTHROID 100 MCG tablet Take 1 tablet (100 mcg total) by mouth daily.  Marland Kitchen azithromycin (ZITHROMAX) 250 MG tablet Two tablets by mouth day one. One tablet by mouth days two through five. Take with food.  . Vitamin D, Ergocalciferol, (DRISDOL) 50000 units CAPS capsule Take 1 capsule (50,000 Units total) by mouth every 7 (seven) days.  . [DISCONTINUED] chlorpheniramine-HYDROcodone (TUSSIONEX) 10-8 MG/5ML SUER Take 5 mLs by mouth every 12 (twelve) hours as needed for cough (cough, will cause drowsiness.).  . [DISCONTINUED] guaiFENesin (ROBITUSSIN) 100 MG/5ML liquid Take 300 mg by mouth every 4 (four) hours as needed for cough.  .  [DISCONTINUED] predniSONE (DELTASONE) 20 MG tablet Take 3 pills a day for 2 days, 2 pills a day for 2 days, 1 pill a day for 2 days then one half pill a day for 2 days then off  . [DISCONTINUED] terbinafine (LAMISIL) 250 MG tablet Take 1 tablet (250 mg total) by mouth daily.  . [DISCONTINUED] Vitamin D, Ergocalciferol, (DRISDOL) 50000 units CAPS capsule Take 1 capsule (50,000 Units total) by mouth every 7 (seven) days.   No facility-administered encounter medications on file as of 01/07/2017.     Allergies: Patient has no known allergies.  Body mass index is 25.72 kg/m.  Blood pressure 104/69, pulse 80, height 5' 8.25" (1.734 m), weight 170 lb 6.4 oz (77.3 kg), last menstrual period 10/29/2000.     Review of Systems  Constitutional: Positive for fatigue. Negative for activity change, appetite change, chills, diaphoresis, fever and unexpected weight change.  HENT: Negative for congestion and postnasal drip.   Eyes: Negative for photophobia, pain, discharge, redness, itching and visual disturbance.  Respiratory: Positive for cough. Negative for chest tightness, shortness of breath, wheezing and stridor.   Cardiovascular: Negative for chest pain, palpitations and leg swelling.  Gastrointestinal: Negative for abdominal distention, abdominal pain, anal bleeding, blood in stool, constipation, diarrhea, nausea, rectal pain and vomiting.  Endocrine: Negative for cold intolerance, heat intolerance, polydipsia, polyphagia and polyuria.  Genitourinary: Negative for difficulty urinating and flank pain.  Musculoskeletal: Positive for arthralgias and myalgias. Negative for back pain, gait problem, joint swelling, neck pain and neck stiffness.       Injured L shoulder Oct 2017 when playing on moneky bars at school.  Skin: Negative for color change, pallor, rash and wound.  Allergic/Immunologic: Negative for immunocompromised state.  Neurological: Negative for dizziness, tremors, weakness,  light-headedness and headaches.  Hematological: Does not bruise/bleed easily.  Psychiatric/Behavioral: Positive for sleep disturbance. Negative for agitation, decreased concentration, self-injury and suicidal ideas. The patient is not nervous/anxious.        Objective:   Physical Exam  Constitutional: She is oriented to person, place, and time. She appears well-developed and well-nourished. No distress.  HENT:  Head: Normocephalic and atraumatic.  Right Ear: Tympanic membrane and external ear normal. Tympanic membrane is not bulging. No decreased hearing is noted.  Left Ear: Tympanic membrane and external ear normal. Tympanic membrane is not bulging. No decreased hearing is noted.  Nose: Nose normal. No mucosal edema or rhinorrhea. Right sinus exhibits no maxillary sinus tenderness and no frontal sinus tenderness. Left sinus exhibits no maxillary sinus tenderness and no frontal sinus tenderness.  Mouth/Throat: Uvula is midline.  Eyes: Conjunctivae are normal. Pupils are equal, round, and reactive to light.  Neck: Normal range of motion. Neck supple. No thyromegaly present.  Cardiovascular: Normal rate, regular rhythm and intact distal pulses.   No murmur heard. Pulmonary/Chest: Effort normal and breath sounds normal. No respiratory distress. She has no wheezes. She has no rales. She exhibits no tenderness.  Deep breathing elicits cough  Abdominal: Soft. Bowel sounds are normal. She exhibits no distension and no mass. There is no tenderness. There is no rebound and no guarding.  Genitourinary: Vagina normal. No erythema in the vagina. No vaginal discharge found.  Musculoskeletal: Normal range of motion. She exhibits tenderness.       Left shoulder: She exhibits tenderness. She exhibits normal range of motion, normal pulse and normal strength.  Tenderness over left bicepital groove.  Lymphadenopathy:    She has no cervical adenopathy.  Neurological: She is alert and oriented to person,  place, and time.  Skin: Skin is warm and dry. No rash noted. She is not diaphoretic. No erythema. No pallor.  Psychiatric: She has a normal mood and affect. Her behavior is normal. Judgment and thought content normal.  Nursing note and vitals reviewed.         Assessment & Plan:   1. Screening for cervical cancer   2. Vitamin D deficiency   3. Insomnia, unspecified type   4. Other specified hypothyroidism     Vitamin D deficiency Take Rx Vit d as directed. Lost previous Rx, requested refill.  Insomnia Practice sleep hygiene.  Hypothyroidism 12/28/16 TSH 3.890 Continue Levothyroxine 100 mcg daily.  Screening for cervical cancer PAP completed today.    FOLLOW-UP:  Return in about 6 months (around 07/09/2017) for Regular Follow Up.

## 2017-01-07 NOTE — Patient Instructions (Signed)
Acute Bronchitis, Adult Acute bronchitis is sudden (acute) swelling of the air tubes (bronchi) in the lungs. Acute bronchitis causes these tubes to fill with mucus, which can make it hard to breathe. It can also cause coughing or wheezing. In adults, acute bronchitis usually goes away within 2 weeks. A cough caused by bronchitis may last up to 3 weeks. Smoking, allergies, and asthma can make the condition worse. Repeated episodes of bronchitis may cause further lung problems, such as chronic obstructive pulmonary disease (COPD). What are the causes? This condition can be caused by germs and by substances that irritate the lungs, including:  Cold and flu viruses. This condition is most often caused by the same virus that causes a cold.  Bacteria.  Exposure to tobacco smoke, dust, fumes, and air pollution. What increases the risk? This condition is more likely to develop in people who:  Have close contact with someone with acute bronchitis.  Are exposed to lung irritants, such as tobacco smoke, dust, fumes, and vapors.  Have a weak immune system.  Have a respiratory condition such as asthma. What are the signs or symptoms? Symptoms of this condition include:  A cough.  Coughing up clear, yellow, or green mucus.  Wheezing.  Chest congestion.  Shortness of breath.  A fever.  Body aches.  Chills.  A sore throat. How is this diagnosed? This condition is usually diagnosed with a physical exam. During the exam, your health care provider may order tests, such as chest X-rays, to rule out other conditions. He or she may also:  Test a sample of your mucus for bacterial infection.  Check the level of oxygen in your blood. This is done to check for pneumonia.  Do a chest X-ray or lung function testing to rule out pneumonia and other conditions.  Perform blood tests. Your health care provider will also ask about your symptoms and medical history. How is this treated? Most cases  of acute bronchitis clear up over time without treatment. Your health care provider may recommend:  Drinking more fluids. Drinking more makes your mucus thinner, which may make it easier to breathe.  Taking a medicine for a fever or cough.  Taking an antibiotic medicine.  Using an inhaler to help improve shortness of breath and to control a cough.  Using a cool mist vaporizer or humidifier to make it easier to breathe. Follow these instructions at home: Medicines  Take over-the-counter and prescription medicines only as told by your health care provider.  If you were prescribed an antibiotic, take it as told by your health care provider. Do not stop taking the antibiotic even if you start to feel better. General instructions  Get plenty of rest.  Drink enough fluids to keep your urine clear or pale yellow.  Avoid smoking and secondhand smoke. Exposure to cigarette smoke or irritating chemicals will make bronchitis worse. If you smoke and you need help quitting, ask your health care provider. Quitting smoking will help your lungs heal faster.  Use an inhaler, cool mist vaporizer, or humidifier as told by your health care provider.  Keep all follow-up visits as told by your health care provider. This is important. How is this prevented? To lower your risk of getting this condition again:  Wash your hands often with soap and water. If soap and water are not available, use hand sanitizer.  Avoid contact with people who have cold symptoms.  Try not to touch your hands to your mouth, nose, or eyes.    eyes.  Make sure to get the flu shot every year. Contact a health care provider if:  Your symptoms do not improve in 2 weeks of treatment. Get help right away if:  You cough up blood.  You have chest pain.  You have severe shortness of breath.  You become dehydrated.  You faint or keep feeling like you are going to faint.  You keep vomiting.  You have a severe headache.  Your  fever or chills gets worse. This information is not intended to replace advice given to you by your health care provider. Make sure you discuss any questions you have with your health care provider. Document Released: 10/18/2004 Document Revised: 04/04/2016 Document Reviewed: 02/29/2016 Elsevier Interactive Patient Education  2017 [place].   Heart-Healthy Eating Plan Many factors influence your heart health, including eating and exercise habits. Heart (coronary) risk increases with abnormal blood fat (lipid) levels. Heart-healthy meal planning includes limiting unhealthy fats, increasing healthy fats, and making other small dietary changes. This includes maintaining a healthy body weight to help keep lipid levels within a normal range. What is my plan? Your health care provider recommends that you:  Get no more than _________% of the total calories in your daily diet from fat.  Limit your intake of saturated fat to less than _________% of your total calories each day.  Limit the amount of cholesterol in your diet to less than _________ mg per day. What types of fat should I choose?  Choose healthy fats more often. Choose monounsaturated and polyunsaturated fats, such as olive oil and canola oil, flaxseeds, walnuts, almonds, and seeds.  Eat more omega-3 fats. Good choices include salmon, mackerel, sardines, tuna, flaxseed oil, and ground flaxseeds. Aim to eat fish at least two times each week.  Limit saturated fats. Saturated fats are primarily found in animal products, such as meats, butter, and cream. Plant sources of saturated fats include palm oil, palm kernel oil, and coconut oil.  Avoid foods with partially hydrogenated oils in them. These contain trans fats. Examples of foods that contain trans fats are stick margarine, some tub margarines, cookies, crackers, and other baked goods. What general guidelines do I need to follow?  Check food labels carefully to identify foods with  trans fats or high amounts of saturated fat.  Fill one half of your plate with vegetables and green salads. Eat 4-5 servings of vegetables per day. A serving of vegetables equals 1 cup of raw leafy vegetables,  cup of raw or cooked cut-up vegetables, or  cup of vegetable juice.  Fill one fourth of your plate with whole grains. Look for the word "whole" as the first word in the ingredient list.  Fill one fourth of your plate with lean protein foods.  Eat 4-5 servings of fruit per day. A serving of fruit equals one medium whole fruit,  cup of dried fruit,  cup of fresh, frozen, or canned fruit, or  cup of 100% fruit juice.  Eat more foods that contain soluble fiber. Examples of foods that contain this type of fiber are apples, broccoli, carrots, beans, peas, and barley. Aim to get 20-30 g of fiber per day.  Eat more home-cooked food and less restaurant, buffet, and fast food.  Limit or avoid alcohol.  Limit foods that are high in starch and sugar.  Avoid fried foods.  Cook foods by using methods other than frying. Baking, boiling, grilling, and broiling are all great options. Other fat-reducing suggestions include:  Removing the  skin from poultry.  Removing all visible fats from meats.  Skimming the fat off of stews, soups, and gravies before serving them.  Steaming vegetables in water or broth.  Lose weight if you are overweight. Losing just 5-10% of your initial body weight can help your overall health and prevent diseases such as diabetes and heart disease.  Increase your consumption of nuts, legumes, and seeds to 4-5 servings per week. One serving of dried beans or legumes equals  cup after being cooked, one serving of nuts equals 1 ounces, and one serving of seeds equals  ounce or 1 tablespoon.  You may need to monitor your salt (sodium) intake, especially if you have high blood pressure. Talk with your health care provider or dietitian to get more information about  reducing sodium. What foods can I eat? Grains   Breads, including [place], white, pita, wheat, raisin, rye, oatmeal, and [place]. Tortillas that are neither fried nor made with lard or trans fat. Low-fat rolls, including hotdog and hamburger buns and English muffins. Biscuits. Muffins. Waffles. Pancakes. Light popcorn. Whole-grain cereals. Flatbread. Melba toast. Pretzels. Breadsticks. Rusks. Low-fat snacks and crackers, including oyster, saltine, matzo, graham, animal, and rye. Rice and pasta, including brown rice and those that are made with whole wheat. Vegetables  All vegetables. Fruits  All fruits, but limit coconut. Meats and Other Protein Sources  Lean, well-trimmed beef, veal, pork, and lamb. Chicken and [place] without skin. All fish and shellfish. Wild duck, rabbit, pheasant, and venison. Egg whites or low-cholesterol egg substitutes. Dried beans, peas, lentils, and tofu.Seeds and most nuts. Dairy  Low-fat or nonfat cheeses, including ricotta, string, and mozzarella. Skim or 1% milk that is liquid, powdered, or evaporated. Buttermilk that is made with low-fat milk. Nonfat or low-fat yogurt. Beverages  Mineral water. Diet carbonated beverages. Sweets and Desserts  Sherbets and fruit ices. Honey, jam, marmalade, jelly, and syrups. Meringues and gelatins. Pure sugar candy, such as hard candy, jelly beans, gumdrops, mints, marshmallows, and small amounts of dark chocolate. [employer] Eat all sweets and desserts in moderation. Fats and Oils  Nonhydrogenated (trans-free) margarines. Vegetable oils, including soybean, sesame, sunflower, olive, peanut, safflower, corn, canola, and cottonseed. Salad dressings or mayonnaise that are made with a vegetable oil. Limit added fats and oils that you use for cooking, baking, salads, and as spreads. Other  Cocoa powder. Coffee and tea. All seasonings and condiments. The items listed above may not be a complete list of recommended foods or  beverages. Contact your dietitian for more options.  What foods are not recommended? Grains  Breads that are made with saturated or trans fats, oils, or whole milk. Croissants. Butter rolls. Cheese breads. Sweet rolls. Donuts. Buttered popcorn. Chow mein noodles. High-fat crackers, such as cheese or butter crackers. Meats and Other Protein Sources  Fatty meats, such as hotdogs, short ribs, sausage, spareribs, bacon, ribeye roast or steak, and mutton. High-fat deli meats, such as salami and bologna. Caviar. Domestic duck and goose. Organ meats, such as kidney, liver, sweetbreads, brains, gizzard, chitterlings, and heart. Dairy  Cream, sour cream, cream cheese, and creamed cottage cheese. Whole milk cheeses, including blue (bleu), Monterey Jack, [place], [place], American, [place], [place], [place], [place], and [place]. Whole or 2% milk that is liquid, evaporated, or condensed. Whole buttermilk. Cream sauce or high-fat cheese sauce. Yogurt that is made from whole milk. Beverages  Regular sodas and drinks with added sugar. Sweets and Desserts  Frosting. Pudding. Cookies. Cakes other than angel food cake. Candy  that has milk chocolate or white chocolate, hydrogenated fat, butter, coconut, or unknown ingredients. Buttered syrups. Full-fat ice cream or ice cream drinks. Fats and Oils  Gravy that has suet, meat fat, or shortening. Cocoa butter, hydrogenated oils, palm oil, coconut oil, palm kernel oil. These can often be found in baked products, candy, fried foods, nondairy creamers, and whipped toppings. Solid fats and shortenings, including bacon fat, salt pork, lard, and butter. Nondairy cream substitutes, such as coffee creamers and sour cream substitutes. Salad dressings that are made of unknown oils, cheese, or sour cream. The items listed above may not be a complete list of foods and beverages to avoid. Contact your dietitian for more information.  This information is not intended to replace advice given  to you by your health care provider. Make sure you discuss any questions you have with your health care provider. Document Released: 06/19/2008 Document Revised: 03/30/2016 Document Reviewed: 03/04/2014 Elsevier Interactive Patient Education  2017 [employer].  Continue medications as directed. For acute bronchitis- Azithromycin as directed.  Increase water/rest/vit c. If symptoms persist after antibiotic complete, then please call clinic. For Left shoulder pain-OTC Ibuprofen 600mg  every 8 hrs with food, as needed for pain.  Apply ice to left shoulder for 65mins daily. Please follow-up in 6 months, sooner if needed.

## 2017-01-07 NOTE — Assessment & Plan Note (Signed)
Practice sleep hygiene.

## 2017-01-07 NOTE — Assessment & Plan Note (Addendum)
Take Rx Vit d as directed. Lost previous Rx, requested refill.

## 2017-01-07 NOTE — Progress Notes (Signed)
3

## 2017-01-07 NOTE — Assessment & Plan Note (Signed)
PAP completed today 

## 2017-01-07 NOTE — Assessment & Plan Note (Signed)
12/28/16 TSH 3.890 Continue Levothyroxine 100 mcg daily.

## 2017-01-09 LAB — CYTOLOGY - PAP
BACTERIAL VAGINITIS: NEGATIVE
CANDIDA VAGINITIS: NEGATIVE
CHLAMYDIA, DNA PROBE: NEGATIVE
DIAGNOSIS: NEGATIVE
Neisseria Gonorrhea: NEGATIVE
Trichomonas: NEGATIVE

## 2017-01-14 LAB — CERVICOVAGINAL ANCILLARY ONLY: HERPES (WINDOWPATH): NEGATIVE

## 2017-01-15 ENCOUNTER — Ambulatory Visit: Payer: Self-pay | Admitting: Sports Medicine

## 2017-01-22 ENCOUNTER — Ambulatory Visit (INDEPENDENT_AMBULATORY_CARE_PROVIDER_SITE_OTHER): Payer: Commercial Managed Care - PPO | Admitting: Sports Medicine

## 2017-01-22 ENCOUNTER — Encounter: Payer: Self-pay | Admitting: Sports Medicine

## 2017-01-22 DIAGNOSIS — B351 Tinea unguium: Secondary | ICD-10-CM | POA: Diagnosis not present

## 2017-01-22 NOTE — Progress Notes (Signed)
Patient ID: [name], female   DOB: 03/02/1956, 61 y.o.   MRN: [id number] Subjective: [name] is a 61 y.o. female patient seen today in office for follow up eval after after completing Lamisil for nail fungus 06/2016. Reports finishing up formula 3. Patient has no other pedal complaints at this time.   Patient Active Problem List   Diagnosis Date Noted  . Screening for cervical cancer 01/07/2017  . Vitamin D deficiency 07/01/2016  . Hypercholesterolemia with hypertriglyceridemia 06/26/2016  . Hypothyroidism 06/04/2016  . Nail fungus 06/04/2016  . History of L hip replacement 06/04/2016  . Insomnia 06/04/2016  . Feeling exhausted 06/04/2016  . Muscle fatigue 06/04/2016  . Ex-cigarette smoker 06/04/2016  . Hepatitis C infection 06/04/2016    Current Outpatient Prescriptions on File Prior to Visit  Medication Sig Dispense Refill  . albuterol (PROVENTIL HFA;VENTOLIN HFA) 108 (90 Base) MCG/ACT inhaler Inhale 1-2 puffs into the lungs every 6 (six) hours as needed for wheezing. 1 Inhaler 1  . azithromycin (ZITHROMAX) 250 MG tablet Two tablets by mouth day one. One tablet by mouth days two through five. Take with food. 6 tablet 0  . Calcium 500 MG CHEW Chew 1 each by mouth at bedtime.    Marland Kitchen OVER THE COUNTER MEDICATION Take 1 tablet by mouth 2 (two) times daily.    . Probiotic Product (PROBIOTIC FORMULA PO) Take 1 capsule by mouth daily.    Marland Kitchen SYNTHROID 100 MCG tablet Take 1 tablet (100 mcg total) by mouth daily. 90 tablet 1  . Vitamin D, Ergocalciferol, (DRISDOL) 50000 units CAPS capsule Take 1 capsule (50,000 Units total) by mouth every 7 (seven) days. 16 capsule 0   No current facility-administered medications on file prior to visit.     No Known Allergies  Objective: Physical Exam  General: Well developed, nourished, no acute distress, awake, alert and oriented x 3  Vascular: Dorsalis pedis artery 2/4 bilateral, Posterior tibial artery 2/4 bilateral, skin  temperature warm to warm proximal to distal bilateral lower extremities, no varicosities, pedal hair present bilateral.  Neurological: Gross sensation present via light touch bilateral.   Dermatological: Skin is warm, dry, and supple bilateral, Nails 1 and 3 on left are tender, short thick, and discolored with mild subungal debris distallyand proximal clearing, all other nails within normal limits, no webspace macerations present bilateral, no open lesions present bilateral, No signs of infection bilateral. Tinea resolved.    Musculoskeletal: Asymptomatic hammertoe boney deformities noted bilateral. Muscular strength within normal limits without painon range of motion. No pain with calf compression bilateral.  Fungal culture + T. 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  [name], DPM

## 2017-02-04 ENCOUNTER — Telehealth: Payer: Self-pay | Admitting: Family Medicine

## 2017-02-04 MED ORDER — SYNTHROID 100 MCG PO TABS: 100.0000 ug | ORAL_TABLET | Freq: Every day | ORAL | 1 refills | Status: DC

## 2017-02-04 NOTE — Addendum Note (Signed)
Addended by: Fonnie Mu on: 02/04/2017 04:36 PM   Modules accepted: Orders

## 2017-02-04 NOTE — Telephone Encounter (Signed)
Patient is requesting a refill of her thyroid meds. She sates that she has seemed to misplaced her bottle and has looked everywhere to no avail. She knows she is not due for a refill but doesn't want to go without taking it.

## 2017-02-05 ENCOUNTER — Telehealth: Payer: Self-pay | Admitting: Family Medicine

## 2017-02-05 ENCOUNTER — Other Ambulatory Visit: Payer: Self-pay

## 2017-02-05 MED ORDER — SYNTHROID 100 MCG PO TABS: 100.0000 ug | ORAL_TABLET | Freq: Every day | ORAL | 1 refills | Status: DC

## 2017-02-05 NOTE — Telephone Encounter (Signed)
Pt called states Synthroid 100 MG should have been sent to [facility] instead of [facility]. --Pls call/ send in as soon as possible. -glh

## 2017-02-05 NOTE — Telephone Encounter (Signed)
Previous RX for Synthroid sent to incorrect pharmacy.  New RX sent to [place] per patient request.  Charyl Bigger, CMA

## 2017-07-30 ENCOUNTER — Ambulatory Visit: Payer: Commercial Managed Care - PPO | Admitting: Sports Medicine

## 2017-08-03 ENCOUNTER — Other Ambulatory Visit: Payer: Self-pay | Admitting: Adult Health

## 2017-08-03 ENCOUNTER — Other Ambulatory Visit: Payer: Self-pay | Admitting: Family Medicine

## 2017-08-05 ENCOUNTER — Other Ambulatory Visit: Payer: Self-pay

## 2017-09-02 ENCOUNTER — Other Ambulatory Visit: Payer: Managed Care, Other (non HMO)

## 2017-09-04 ENCOUNTER — Encounter: Payer: Self-pay | Admitting: Family Medicine

## 2017-09-04 ENCOUNTER — Other Ambulatory Visit (INDEPENDENT_AMBULATORY_CARE_PROVIDER_SITE_OTHER): Payer: Commercial Managed Care - PPO

## 2017-09-04 ENCOUNTER — Other Ambulatory Visit: Payer: Self-pay

## 2017-09-04 DIAGNOSIS — Z0000 Encounter for general adult medical examination without abnormal findings: Secondary | ICD-10-CM

## 2017-09-04 DIAGNOSIS — E559 Vitamin D deficiency, unspecified: Secondary | ICD-10-CM

## 2017-09-04 DIAGNOSIS — E782 Mixed hyperlipidemia: Secondary | ICD-10-CM

## 2017-09-04 DIAGNOSIS — E039 Hypothyroidism, unspecified: Secondary | ICD-10-CM

## 2017-09-04 DIAGNOSIS — B182 Chronic viral hepatitis C: Secondary | ICD-10-CM

## 2017-09-04 DIAGNOSIS — M6289 Other specified disorders of muscle: Secondary | ICD-10-CM

## 2017-09-05 ENCOUNTER — Ambulatory Visit: Payer: Managed Care, Other (non HMO) | Admitting: Family Medicine

## 2017-09-05 ENCOUNTER — Encounter: Payer: Self-pay | Admitting: Adult Health

## 2017-09-05 ENCOUNTER — Ambulatory Visit (INDEPENDENT_AMBULATORY_CARE_PROVIDER_SITE_OTHER): Payer: Commercial Managed Care - PPO | Admitting: Adult Health

## 2017-09-05 VITALS — BP 114/84 | HR 86 | Ht 68.25 in | Wt 172.8 lb

## 2017-09-05 DIAGNOSIS — Z0000 Encounter for general adult medical examination without abnormal findings: Secondary | ICD-10-CM | POA: Diagnosis not present

## 2017-09-05 DIAGNOSIS — E039 Hypothyroidism, unspecified: Secondary | ICD-10-CM

## 2017-09-05 DIAGNOSIS — R29898 Other symptoms and signs involving the musculoskeletal system: Secondary | ICD-10-CM | POA: Insufficient documentation

## 2017-09-05 DIAGNOSIS — E559 Vitamin D deficiency, unspecified: Secondary | ICD-10-CM

## 2017-09-05 LAB — CBC WITH DIFFERENTIAL/PLATELET
BASOS ABS: 0 10*3/uL (ref 0.0–0.2)
Basos: 0 %
EOS (ABSOLUTE): 0.1 10*3/uL (ref 0.0–0.4)
EOS: 2 %
HEMATOCRIT: 42.9 % (ref 34.0–46.6)
HEMOGLOBIN: 14.7 g/dL (ref 11.1–15.9)
IMMATURE GRANS (ABS): 0 10*3/uL (ref 0.0–0.1)
Immature Granulocytes: 0 %
LYMPHS ABS: 2.9 10*3/uL (ref 0.7–3.1)
LYMPHS: 42 %
MCH: 31 pg (ref 26.6–33.0)
MCHC: 34.3 g/dL (ref 31.5–35.7)
MCV: 91 fL (ref 79–97)
MONOCYTES: 8 %
Monocytes Absolute: 0.6 10*3/uL (ref 0.1–0.9)
Neutrophils Absolute: 3.3 10*3/uL (ref 1.4–7.0)
Neutrophils: 48 %
Platelets: 309 10*3/uL (ref 150–379)
RBC: 4.74 x10E6/uL (ref 3.77–5.28)
RDW: 13.3 % (ref 12.3–15.4)
WBC: 6.9 10*3/uL (ref 3.4–10.8)

## 2017-09-05 LAB — COMPREHENSIVE METABOLIC PANEL
A/G RATIO: 1.7 (ref 1.2–2.2)
ALBUMIN: 4.5 g/dL (ref 3.6–4.8)
ALT: 15 IU/L (ref 0–32)
AST: 16 IU/L (ref 0–40)
Alkaline Phosphatase: 60 IU/L (ref 39–117)
BUN / CREAT RATIO: 15 (ref 12–28)
BUN: 12 mg/dL (ref 8–27)
Bilirubin Total: 0.7 mg/dL (ref 0.0–1.2)
CALCIUM: 10 mg/dL (ref 8.7–10.3)
CHLORIDE: 103 mmol/L (ref 96–106)
CO2: 27 mmol/L (ref 20–29)
CREATININE: 0.79 mg/dL (ref 0.57–1.00)
GFR calc Af Amer: 94 mL/min/{1.73_m2} (ref >59)
GFR, EST NON AFRICAN AMERICAN: 82 mL/min/{1.73_m2} (ref >59)
GLOBULIN, TOTAL: 2.7 g/dL (ref 1.5–4.5)
Glucose: 96 mg/dL (ref 65–99)
POTASSIUM: 4.8 mmol/L (ref 3.5–5.2)
SODIUM: 143 mmol/L (ref 134–144)
Total Protein: 7.2 g/dL (ref 6.0–8.5)

## 2017-09-05 LAB — LIPID PANEL
CHOL/HDL RATIO: 4 ratio (ref 0.0–4.4)
Cholesterol, Total: 187 mg/dL (ref 100–199)
HDL: 47 mg/dL (ref >39)
LDL Calculated: 109 mg/dL — ABNORMAL HIGH (ref 0–99)
TRIGLYCERIDES: 157 mg/dL — AB (ref 0–149)
VLDL CHOLESTEROL CAL: 31 mg/dL (ref 5–40)

## 2017-09-05 LAB — HEMOGLOBIN A1C
Est. average glucose Bld gHb Est-mCnc: 114 mg/dL
HEMOGLOBIN A1C: 5.6 % (ref 4.8–5.6)

## 2017-09-05 LAB — VITAMIN D 25 HYDROXY (VIT D DEFICIENCY, FRACTURES): VIT D 25 HYDROXY: 31.9 ng/mL (ref 30.0–100.0)

## 2017-09-05 LAB — TSH: TSH: 2.4 u[IU]/mL (ref 0.450–4.500)

## 2017-09-05 MED ORDER — SYNTHROID 100 MCG PO TABS: 100.0000 ug | ORAL_TABLET | Freq: Every day | ORAL | 1 refills | Status: DC

## 2017-09-05 NOTE — Assessment & Plan Note (Signed)
Increase water intake, strive for at least 80 ounces/day.   Follow Heart Healthy diet Increase regular exercise.  Recommend at least 30 minutes daily, 5 days per week of walking, jogging, biking, swimming, YouTube/Pinterest workout videos. Regular follow-up in 3 months.

## 2017-09-05 NOTE — Patient Instructions (Signed)
Heart-Healthy Eating Plan Many factors influence your heart health, including eating and exercise habits. Heart (coronary) risk increases with abnormal blood fat (lipid) levels. Heart-healthy meal planning includes limiting unhealthy fats, increasing healthy fats, and making other small dietary changes. This includes maintaining a healthy body weight to help keep lipid levels within a normal range. What is my plan? Your health care provider recommends that you:  Get no more than __25__% of the total calories in your daily diet from fat.  Limit your intake of saturated fat to less than ___5___% of your total calories each day.  Limit the amount of cholesterol in your diet to less than __300__ mg per day.  What types of fat should I choose?  Choose healthy fats more often. Choose monounsaturated and polyunsaturated fats, such as olive oil and canola oil, flaxseeds, walnuts, almonds, and seeds.  Eat more omega-3 fats. Good choices include salmon, mackerel, sardines, tuna, flaxseed oil, and ground flaxseeds. Aim to eat fish at least two times each week.  Limit saturated fats. Saturated fats are primarily found in animal products, such as meats, butter, and cream. Plant sources of saturated fats include palm oil, palm kernel oil, and coconut oil.  Avoid foods with partially hydrogenated oils in them. These contain trans fats. Examples of foods that contain trans fats are stick margarine, some tub margarines, cookies, crackers, and other baked goods. What general guidelines do I need to follow?  Check food labels carefully to identify foods with trans fats or high amounts of saturated fat.  Fill one half of your plate with vegetables and green salads. Eat 4-5 servings of vegetables per day. A serving of vegetables equals 1 cup of raw leafy vegetables,  cup of raw or cooked cut-up vegetables, or  cup of vegetable juice.  Fill one fourth of your plate with whole grains. Look for the word "whole"  as the first word in the ingredient list.  Fill one fourth of your plate with lean protein foods.  Eat 4-5 servings of fruit per day. A serving of fruit equals one medium whole fruit,  cup of dried fruit,  cup of fresh, frozen, or canned fruit, or  cup of 100% fruit juice.  Eat more foods that contain soluble fiber. Examples of foods that contain this type of fiber are apples, broccoli, carrots, beans, peas, and barley. Aim to get 20-30 g of fiber per day.  Eat more home-cooked food and less restaurant, buffet, and fast food.  Limit or avoid alcohol.  Limit foods that are high in starch and sugar.  Avoid fried foods.  Cook foods by using methods other than frying. Baking, boiling, grilling, and broiling are all great options. Other fat-reducing suggestions include: ? Removing the skin from poultry. ? Removing all visible fats from meats. ? Skimming the fat off of stews, soups, and gravies before serving them. ? Steaming vegetables in water or broth.  Lose weight if you are overweight. Losing just 5-10% of your initial body weight can help your overall health and prevent diseases such as diabetes and heart disease.  Increase your consumption of nuts, legumes, and seeds to 4-5 servings per week. One serving of dried beans or legumes equals  cup after being cooked, one serving of nuts equals 1 ounces, and one serving of seeds equals  ounce or 1 tablespoon.  You may need to monitor your salt (sodium) intake, especially if you have high blood pressure. Talk with your health care provider or dietitian to get  more information about reducing sodium. What foods can I eat? Grains  Breads, including [place], white, pita, wheat, raisin, rye, oatmeal, and [place]. Tortillas that are neither fried nor made with lard or trans fat. Low-fat rolls, including hotdog and hamburger buns and English muffins. Biscuits. Muffins. Waffles. Pancakes. Light popcorn. Whole-grain cereals. Flatbread. Melba  toast. Pretzels. Breadsticks. Rusks. Low-fat snacks and crackers, including oyster, saltine, matzo, graham, animal, and rye. Rice and pasta, including brown rice and those that are made with whole wheat. Vegetables All vegetables. Fruits All fruits, but limit coconut. Meats and Other Protein Sources Lean, well-trimmed beef, veal, pork, and lamb. Chicken and [place] without skin. All fish and shellfish. Wild duck, rabbit, pheasant, and venison. Egg whites or low-cholesterol egg substitutes. Dried beans, peas, lentils, and tofu.Seeds and most nuts. Dairy Low-fat or nonfat cheeses, including ricotta, string, and mozzarella. Skim or 1% milk that is liquid, powdered, or evaporated. Buttermilk that is made with low-fat milk. Nonfat or low-fat yogurt. Beverages Mineral water. Diet carbonated beverages. Sweets and Desserts Sherbets and fruit ices. Honey, jam, marmalade, jelly, and syrups. Meringues and gelatins. Pure sugar candy, such as hard candy, jelly beans, gumdrops, mints, marshmallows, and small amounts of dark chocolate. [employer] Eat all sweets and desserts in moderation. Fats and Oils Nonhydrogenated (trans-free) margarines. Vegetable oils, including soybean, sesame, sunflower, olive, peanut, safflower, corn, canola, and cottonseed. Salad dressings or mayonnaise that are made with a vegetable oil. Limit added fats and oils that you use for cooking, baking, salads, and as spreads. Other Cocoa powder. Coffee and tea. All seasonings and condiments. The items listed above may not be a complete list of recommended foods or beverages. Contact your dietitian for more options. What foods are not recommended? Grains Breads that are made with saturated or trans fats, oils, or whole milk. Croissants. Butter rolls. Cheese breads. Sweet rolls. Donuts. Buttered popcorn. Chow mein noodles. High-fat crackers, such as cheese or butter crackers. Meats and Other Protein Sources Fatty meats, such as  hotdogs, short ribs, sausage, spareribs, bacon, ribeye roast or steak, and mutton. High-fat deli meats, such as salami and bologna. Caviar. Domestic duck and goose. Organ meats, such as kidney, liver, sweetbreads, brains, gizzard, chitterlings, and heart. Dairy Cream, sour cream, cream cheese, and creamed cottage cheese. Whole milk cheeses, including blue (bleu), Monterey Jack, [place], [place], American, [place], Swiss, [place], [place], and [place]. Whole or 2% milk that is liquid, evaporated, or condensed. Whole buttermilk. Cream sauce or high-fat cheese sauce. Yogurt that is made from whole milk. Beverages Regular sodas and drinks with added sugar. Sweets and Desserts Frosting. Pudding. Cookies. Cakes other than angel food cake. Candy that has milk chocolate or white chocolate, hydrogenated fat, butter, coconut, or unknown ingredients. Buttered syrups. Full-fat ice cream or ice cream drinks. Fats and Oils Gravy that has suet, meat fat, or shortening. Cocoa butter, hydrogenated oils, palm oil, coconut oil, palm kernel oil. These can often be found in baked products, candy, fried foods, nondairy creamers, and whipped toppings. Solid fats and shortenings, including bacon fat, salt pork, lard, and butter. Nondairy cream substitutes, such as coffee creamers and sour cream substitutes. Salad dressings that are made of unknown oils, cheese, or sour cream. The items listed above may not be a complete list of foods and beverages to avoid. Contact your dietitian for more information. This information is not intended to replace advice given to you by your health care provider. Make sure you discuss any questions you have with your health care  provider. Document Released: 06/19/2008 Document Revised: 03/30/2016 Document Reviewed: 03/04/2014 Elsevier Interactive Patient Education  2017 [employer].   Exercising to [employer] Exercising regularly is important. It has many health benefits, such  as:  Improving your overall fitness, flexibility, and endurance.  Increasing your bone density.  Helping with weight control.  Decreasing your body fat.  Increasing your muscle strength.  Reducing stress and tension.  Improving your overall health.  In order to become healthy and stay healthy, it is recommended that you do moderate-intensity and vigorous-intensity exercise. You can tell that you are exercising at a moderate intensity if you have a higher heart rate and faster breathing, but you are still able to hold a conversation. You can tell that you are exercising at a vigorous intensity if you are breathing much harder and faster and cannot hold a conversation while exercising. How often should I exercise? Choose an activity that you enjoy and set realistic goals. Your health care provider can help you to make an activity plan that works for you. Exercise regularly as directed by your health care provider. This may include:  Doing resistance training twice each week, such as: ? Push-ups. ? Sit-ups. ? Lifting weights. ? Using resistance bands.  Doing a given intensity of exercise for a given amount of time. Choose from these options: ? 150 minutes of moderate-intensity exercise every week. ? 75 minutes of vigorous-intensity exercise every week. ? A mix of moderate-intensity and vigorous-intensity exercise every week.  Children, pregnant women, people who are out of shape, people who are overweight, and older adults may need to consult a health care provider for individual recommendations. If you have any sort of medical condition, be sure to consult your health care provider before starting a new exercise program. What are some exercise ideas? Some moderate-intensity exercise ideas include:  Walking at a rate of 1 mile in 15 minutes.  Biking.  Hiking.  Golfing.  Dancing.  Some vigorous-intensity exercise ideas include:  Walking at a rate of at least 4.5 miles per  hour.  Jogging or running at a rate of 5 miles per hour.  Biking at a rate of at least 10 miles per hour.  Lap swimming.  Roller-skating or in-line skating.  Cross-country skiing.  Vigorous competitive sports, such as football, basketball, and soccer.  Jumping rope.  Aerobic dancing.  What are some everyday activities that can help me to get exercise?  [place] work, such as: ? Pushing a Conservation officer, nature. ? Raking and bagging leaves.  Washing and waxing your car.  Pushing a stroller.  Shoveling snow.  Gardening.  Washing windows or floors. How can I be more active in my day-to-day activities?  Use the stairs instead of the elevator.  Take a walk during your lunch break.  If you drive, park your car farther away from work or school.  If you take public transportation, get off one stop early and walk the rest of the way.  Make all of your phone calls while standing up and walking around.  Get up, stretch, and walk around every 30 minutes throughout the day. What guidelines should I follow while exercising?  Do not exercise so much that you hurt yourself, feel dizzy, or get very short of breath.  Consult your health care provider before starting a new exercise program.  Wear comfortable clothes and shoes with good support.  Drink plenty of water while you exercise to prevent dehydration or heat stroke. Body water is lost during  exercise and must be replaced.  Work out until you breathe faster and your heart beats faster. This information is not intended to replace advice given to you by your health care provider. Make sure you discuss any questions you have with your health care provider. Document Released: 10/13/2010 Document Revised: 02/16/2016 Document Reviewed: 02/11/2014 Elsevier Interactive Patient Education  2018 [place] look good, continue current dose of Levothyroxine. Once you complete rx vit d, switch to OTC vit d daily. Increase water intake,  strive for at least 80 ounces/day.   Follow Heart Healthy diet Increase regular exercise.  Recommend at least 30 minutes daily, 5 days per week of walking, jogging, biking, swimming, YouTube/Pinterest workout videos. PT referral placed, once completed please follow-up to determine if weakness has improved. Regular follow-up in 3 months. NICE TO SEE YOU!

## 2017-09-05 NOTE — Progress Notes (Signed)
Subjective:    Patient ID: [name], female    DOB: 1956-04-09, 61 y.o.   MRN: [id number]  HPI:  [name] is here for med refill, review labs, and complaint of bil lower ext. Weakness that has been worsening >2 years.  She has not seen Ortho/PT . 2 years.  She walks her dog "stella" daily > 1 mile, however does not participate in any physical activity.  She denies any recent falls, however is concerned about mobility limitations.   She denies CP/dyspnea/palpitations/dizziness.  Patient Care Team    Relationship Specialty Notifications Start End  Mellody Dance, DO PCP - General Family Medicine  06/04/16   A[place]ton, Laurice Record, MD (Inactive) Consulting Physician Orthopedic Surgery  06/04/16    Comment: L THR in Feb 2013  Paralee Cancel, MD Consulting Physician Orthopedic Surgery  06/04/16   Arta Silence, MD Consulting Physician Gastroenterology  06/04/16    Comment: Hep C txmnt  Druscilla Brownie, MD Consulting Physician Dermatology  06/04/16   Landis Martins, DPM Consulting Physician Podiatry  06/04/16     Patient Active Problem List   Diagnosis Date Noted  . Weakness of both lower extremities 09/05/2017  . Healthcare maintenance 09/05/2017  . Screening for cervical cancer 01/07/2017  . Vitamin D deficiency 07/01/2016  . Hypercholesterolemia with hypertriglyceridemia 06/26/2016  . Hypothyroidism 06/04/2016  . Nail fungus 06/04/2016  . History of L hip replacement 06/04/2016  . Insomnia 06/04/2016  . Feeling exhausted 06/04/2016  . Muscle fatigue 06/04/2016  . Ex-cigarette smoker 06/04/2016  . Hepatitis C infection 06/04/2016     Past Medical History:  Diagnosis Date  . Arthritis   . Blood transfusion    1968  . Blood transfusion without reported diagnosis   . Cancer ([place])    cervical  . Complication of anesthesia    sensitive to all meds- takes longer to metabolize  . 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 Hx of BLACK WIDOW  SPIDER BITE x 2 left shoulder 10/11 with  "knot at base of scull"   seen by PCP- no neuro visit- resolved  . 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 Hypoglycemia   . Hypothyroidism      Past Surgical History:  Procedure Laterality Date  . CERVICAL CONE BIOPSY    . COLONOSCOPY    . EYE SURGERY     Lasic- left eye  . TONSILLECTOMY    . TOTAL HIP ARTHROPLASTY  10/30/2011   Procedure: TOTAL HIP ARTHROPLASTY;  Surgeon: Magnus Sinning, MD;  Location: [facility];  Service: Orthopedics;  Laterality: Left;     Family History  Adopted: Yes     Social History   Substance and Sexual Activity  Drug Use No   Comment: marijuana, cocaine in college     Social History   Substance and Sexual Activity  Alcohol Use Yes   Comment: 1-2/ monthly     Social History   Tobacco Use  Smoking Status Former Smoker  . Packs/day: 0.50  . Years: 38.00  . Pack years: 19.00  . Types: Cigarettes  . Last attempt to quit: 03/24/2016  . Years since quitting: 1.4  Smokeless Tobacco Never Used  Tobacco Comment   5-6 cigarettes day     Outpatient Encounter Medications as of 09/05/2017  Medication Sig  . Calcium 500 MG CHEW Chew 1 each by mouth at bedtime.  . Probiotic Product (PROBIOTIC FORMULA PO) Take 1 capsule by mouth daily.  Marland Kitchen  SYNTHROID 100 MCG tablet Take 1 tablet (100 mcg total) by mouth daily.  . Vitamin D, Ergocalciferol, (DRISDOL) 50000 units CAPS capsule TAKE 1 CAPSULE EVERY 7 DAYS  . [DISCONTINUED] SYNTHROID 100 MCG tablet Take 1 tablet (100 mcg total) by mouth daily.  Marland Kitchen OVER THE COUNTER MEDICATION Take 1 tablet by mouth 2 (two) times daily.  . [DISCONTINUED] albuterol (PROVENTIL HFA;VENTOLIN HFA) 108 (90 Base) MCG/ACT inhaler Inhale 1-2 puffs into the lungs every 6 (six) hours as needed for wheezing.  . [DISCONTINUED] azithromycin (ZITHROMAX) 250 MG tablet Two tablets by mouth day one. One tablet by mouth days two through five. Take with food.  . [DISCONTINUED] SYNTHROID 100 MCG tablet  TAKE 1 TABLET DAILY   No facility-administered encounter medications on file as of 09/05/2017.     Allergies: Patient has no known allergies.  Body mass index is 26.08 kg/m.  Blood pressure 114/84, pulse 86, height 5' 8.25" (1.734 m), weight 172 lb 12.8 oz (78.4 kg), last menstrual period 10/29/2000.     Review of Systems  Constitutional: Positive for fatigue. Negative for activity change, appetite change, chills, diaphoresis, fever and unexpected weight change.  HENT: Negative for congestion.   Eyes: Negative for visual disturbance.  Respiratory: Negative for cough, chest tightness, shortness of breath, wheezing and stridor.   Cardiovascular: Negative for chest pain, palpitations and leg swelling.  Gastrointestinal: Negative for abdominal distention, abdominal pain, blood in stool, constipation, diarrhea, nausea and vomiting.  Endocrine: Negative for cold intolerance, heat intolerance, polydipsia, polyphagia and polyuria.  Genitourinary: Negative for difficulty urinating and flank pain.  Musculoskeletal: Positive for arthralgias, joint swelling and myalgias. Negative for back pain, gait problem, neck pain and neck stiffness.  Skin: Negative for color change, pallor, rash and wound.  Neurological: Positive for weakness. Negative for dizziness and headaches.  Hematological: Does not bruise/bleed easily.  Psychiatric/Behavioral: Negative for decreased concentration, self-injury, sleep disturbance and suicidal ideas. The patient is not nervous/anxious and is not hyperactive.        Objective:   Physical Exam  Constitutional: She is oriented to person, place, and time. She appears well-developed and well-nourished. No distress.  HENT:  Head: Normocephalic and atraumatic.  Right Ear: External ear normal.  Left Ear: External ear normal.  Eyes: Conjunctivae are normal. Pupils are equal, round, and reactive to light.  Neck: Normal range of motion. Neck supple.  Cardiovascular:  Normal rate, regular rhythm and normal heart sounds.  No murmur heard. Pulmonary/Chest: Effort normal and breath sounds normal. No respiratory distress. She has no wheezes. She has no rales. She exhibits no tenderness.  Musculoskeletal: She exhibits tenderness.       Right hip: She exhibits tenderness.       Left hip: She exhibits tenderness.       Right knee: Tenderness found.       Left knee: Tenderness found.  Lymphadenopathy:    She has no cervical adenopathy.  Neurological: She is alert and oriented to person, place, and time.  Skin: Skin is warm and dry. No rash noted. She is not diaphoretic. No erythema. No pallor.  Psychiatric: She has a normal mood and affect. Her behavior is normal. Judgment and thought content normal.          Assessment & Plan:   1. Weakness of both lower extremities   2. Vitamin D deficiency   3. Hypothyroidism, unspecified type   4. Healthcare maintenance     Weakness of both lower extremities Continue daily walking PT referral  placed, f/u after therapy completed  Hypothyroidism 09/04/17 TSH 2.40 She denies fatigue/palpitations/sweating Continue Levothyroxine 144mcg daily- refill sent in   Healthcare maintenance Increase water intake, strive for at least 80 ounces/day.   Follow Heart Healthy diet Increase regular exercise.  Recommend at least 30 minutes daily, 5 days per week of walking, jogging, biking, swimming, YouTube/Pinterest workout videos. Regular follow-up in 3 months.  Vitamin D deficiency 09/04/17 vit d 31.9, improved from 18 last year Once you complete rx vit d, switch to OTC vit d daily.    FOLLOW-UP:  Return in about 3 months (around 12/04/2017) for Regular Follow Up.

## 2017-09-05 NOTE — Assessment & Plan Note (Addendum)
09/04/17 TSH 2.40 She denies fatigue/palpitations/sweating Continue Levothyroxine 116mcg daily- refill sent in

## 2017-09-05 NOTE — Assessment & Plan Note (Signed)
Continue daily walking PT referral placed, f/u after therapy completed

## 2017-09-05 NOTE — Assessment & Plan Note (Signed)
09/04/17 vit d 31.9, improved from 18 last year Once you complete rx vit d, switch to OTC vit d daily.

## 2017-09-10 ENCOUNTER — Ambulatory Visit: Payer: Managed Care, Other (non HMO) | Admitting: Family Medicine

## 2017-11-18 ENCOUNTER — Ambulatory Visit: Payer: Managed Care, Other (non HMO) | Admitting: Family Medicine

## 2017-12-02 ENCOUNTER — Encounter: Payer: Self-pay | Admitting: Adult Health

## 2017-12-02 ENCOUNTER — Ambulatory Visit (INDEPENDENT_AMBULATORY_CARE_PROVIDER_SITE_OTHER): Payer: Commercial Managed Care - PPO | Admitting: Adult Health

## 2017-12-02 VITALS — BP 120/72 | HR 89 | Temp 98.1°F | Ht 68.25 in | Wt 168.5 lb

## 2017-12-02 DIAGNOSIS — J0100 Acute maxillary sinusitis, unspecified: Secondary | ICD-10-CM

## 2017-12-02 DIAGNOSIS — R05 Cough: Secondary | ICD-10-CM | POA: Diagnosis not present

## 2017-12-02 DIAGNOSIS — R059 Cough, unspecified: Secondary | ICD-10-CM | POA: Insufficient documentation

## 2017-12-02 MED ORDER — HYDROCOD POLST-CPM POLST ER 10-8 MG/5ML PO SUER: 5.0000 mL | Freq: Two times a day (BID) | ORAL | 0 refills | Status: DC | PRN

## 2017-12-02 MED ORDER — AMOXICILLIN-POT CLAVULANATE 875-125 MG PO TABS: 1.0000 | ORAL_TABLET | Freq: Two times a day (BID) | ORAL | 0 refills | Status: DC

## 2017-12-02 MED ORDER — FLUTICASONE PROPIONATE 50 MCG/ACT NA SUSP: 2.0000 | Freq: Every day | NASAL | 6 refills | Status: DC

## 2017-12-02 NOTE — Progress Notes (Signed)
Subjective:    Patient ID: [name], female    DOB: 09/18/1956, 62 y.o.   MRN: [id number]  HPI:  Ms. Breiner presents with copious nasal drainage (thick/yellow), productive cough (thick/yellow), frontal HA (4/10), fatigue, and night sweats that have been occurring >10 days and has been steadily worsening.  She increased fluids/vit c and been using OTC Advil and Robitussin with only min sx relief. 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 She denies CP/dyspnea/palpitations/N/V/D  Patient Care Team    Relationship Specialty Notifications Start End  Mina Marble D, NP PCP - General Family Medicine  09/05/17   Aplington, Laurice Record, MD (Inactive) Consulting Physician Orthopedic Surgery  06/04/16    Comment: L THR in Feb 2013  Paralee Cancel, MD Consulting Physician Orthopedic Surgery  06/04/16   Arta Silence, MD Consulting Physician Gastroenterology  06/04/16    Comment: Hep C txmnt  Druscilla Brownie, MD Consulting Physician Dermatology  06/04/16   Landis Martins, DPM Consulting Physician Podiatry  06/04/16     Patient Active Problem List   Diagnosis Date Noted  . Weakness of both lower extremities 09/05/2017  . Healthcare maintenance 09/05/2017  . Screening for cervical cancer 01/07/2017  . Vitamin D deficiency 07/01/2016  . Hypercholesterolemia with hypertriglyceridemia 06/26/2016  . Hypothyroidism 06/04/2016  . Nail fungus 06/04/2016  . History of L hip replacement 06/04/2016  . Insomnia 06/04/2016  . Feeling exhausted 06/04/2016  . Muscle fatigue 06/04/2016  . Ex-cigarette smoker 06/04/2016  . Hepatitis C infection 06/04/2016     Past Medical History:  Diagnosis Date  . Arthritis   . Blood transfusion    1968  . Blood transfusion without reported diagnosis   . Cancer ([place])    cervical  . Complication of anesthesia    sensitive to all meds- takes longer to metabolize  . 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 Hx of BLACK WIDOW SPIDER BITE x 2 left shoulder 10/11 with  "knot at base of scull"   seen by PCP- no neuro visit- resolved  . 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 Hypoglycemia   . Hypothyroidism      Past Surgical History:  Procedure Laterality Date  . CERVICAL CONE BIOPSY    . COLONOSCOPY    . EYE SURGERY     Lasic- left eye  . TONSILLECTOMY    . TOTAL HIP ARTHROPLASTY  10/30/2011   Procedure: TOTAL HIP ARTHROPLASTY;  Surgeon: Magnus Sinning, MD;  Location: [facility];  Service: Orthopedics;  Laterality: Left;     Family History  Adopted: Yes     Social History   Substance and Sexual Activity  Drug Use No   Comment: marijuana, cocaine in college     Social History   Substance and Sexual Activity  Alcohol Use Yes   Comment: 1-2/ monthly     Social History   Tobacco Use  Smoking Status Former Smoker  . Packs/day: 0.50  . Years: 38.00  . Pack years: 19.00  . Types: Cigarettes  . Last attempt to quit: 03/24/2016  . Years since quitting: 1.6  Smokeless Tobacco Never Used  Tobacco Comment   5-6 cigarettes day     Outpatient Encounter Medications as of 12/02/2017  Medication Sig  . Calcium 500 MG CHEW Chew 1 each by mouth at bedtime.  Marland Kitchen OVER THE COUNTER MEDICATION Take 1 tablet by mouth 2 (two) times  daily.  . Probiotic Product (PROBIOTIC FORMULA PO) Take 1 capsule by mouth daily.  Marland Kitchen SYNTHROID 100 MCG tablet Take 1 tablet (100 mcg total) by mouth daily.  . Vitamin D, Ergocalciferol, (DRISDOL) 50000 units CAPS capsule TAKE 1 CAPSULE EVERY 7 DAYS   No facility-administered encounter medications on file as of 12/02/2017.     Allergies: Patient has no known allergies.  Body mass index is 25.43 kg/m.  Blood pressure 120/72, pulse 89, temperature 98.1 F (36.7 C), temperature source Oral, height 5' 8.25" (1.734 m), weight 168 lb 8 oz (76.4 kg), last menstrual period 10/29/2000, SpO2 95 %.     Review of Systems  Constitutional: Positive for  activity change and fatigue. Negative for appetite change, chills, diaphoresis, fever and unexpected weight change.  HENT: Positive for congestion, postnasal drip, rhinorrhea, sinus pressure, sinus pain, sore throat and voice change.   Eyes: Negative for visual disturbance.  Respiratory: Positive for cough. Negative for chest tightness, shortness of breath, wheezing and stridor.   Cardiovascular: Negative for chest pain and leg swelling.  Gastrointestinal: Negative for abdominal distention, abdominal pain, blood in stool, constipation, diarrhea, nausea and vomiting.  Neurological: Positive for headaches. Negative for dizziness.  Hematological: Does not bruise/bleed easily.  Psychiatric/Behavioral: Positive for sleep disturbance.       Objective:   Physical Exam  Constitutional: She is oriented to person, place, and time. She appears well-developed and well-nourished. She has a sickly appearance.  HENT:  Head: Normocephalic and atraumatic.  Right Ear: Hearing, external ear and ear canal normal. Tympanic membrane is bulging. Tympanic membrane is not erythematous. No decreased hearing is noted.  Left Ear: Hearing, external ear and ear canal normal. Tympanic membrane is bulging. Tympanic membrane is not erythematous. No decreased hearing is noted.  Nose: Mucosal edema and rhinorrhea present. Right sinus exhibits maxillary sinus tenderness and frontal sinus tenderness. Left sinus exhibits maxillary sinus tenderness and frontal sinus tenderness.  Mouth/Throat: Uvula is midline and mucous membranes are normal. Posterior oropharyngeal edema and posterior oropharyngeal erythema present. No oropharyngeal exudate or tonsillar abscesses.  Eyes: Conjunctivae are normal. Pupils are equal, round, and reactive to light.  Neck: Normal range of motion. Neck supple.  Cardiovascular: Normal rate, regular rhythm, normal heart sounds and intact distal pulses.  No murmur heard. Pulmonary/Chest: Effort normal and  breath sounds normal. No respiratory distress. She has no wheezes. She has no rales. She exhibits no tenderness.  Lymphadenopathy:    She has no cervical adenopathy.  Neurological: She is alert and oriented to person, place, and time.  Skin: Skin is warm and dry. No rash noted. She is not diaphoretic. No erythema. No pallor.  Warm to the touch  Psychiatric: Judgment and thought content normal.  Nursing note and vitals reviewed.         Assessment & Plan:   1. Acute maxillary sinusitis, recurrence not specified   2. Cough in adult     Cough in adult [place] Controlled Substance Database reviewed- no contraindications noted. Please take Augmentin, Flonase, and Tussionex as directed. Increase fluids/rest/vit c-2,000mg /day. Continue OTC Ibuprofen as directed by manufacturer. If symptoms persist after Augmentin completed, please call clinic. Work Excuse provided- okay to return 12/05/17  Acute maxillary sinusitis Please take Augmentin, Flonase, and Tussionex as directed. Increase fluids/rest/vit c-2,000mg /day. Continue OTC Ibuprofen as directed by manufacturer. If symptoms persist after Augmentin completed, please call clinic. Work Excuse provided- okay to return 12/05/17.    FOLLOW-UP:  Return if symptoms worsen or fail to  improve.

## 2017-12-02 NOTE — Assessment & Plan Note (Addendum)
Please take Augmentin, Flonase, and Tussionex as directed. Increase fluids/rest/vit c-2,000mg /day. Continue OTC Ibuprofen as directed by manufacturer. If symptoms persist after Augmentin completed, please call clinic. Work Excuse provided- okay to return 12/05/17.

## 2017-12-02 NOTE — Assessment & Plan Note (Addendum)
[place] Controlled Substance Database reviewed- no contraindications noted. Please take Augmentin, Flonase, and Tussionex as directed. Increase fluids/rest/vit c-2,000mg /day. Continue OTC Ibuprofen as directed by manufacturer. If symptoms persist after Augmentin completed, please call clinic. Work Excuse provided- okay to return 12/05/17

## 2017-12-02 NOTE — Patient Instructions (Addendum)
Sinusitis, Adult Sinusitis is soreness and inflammation of your sinuses. Sinuses are hollow spaces in the bones around your face. Your sinuses are located:  Around your eyes.  In the middle of your forehead.  Behind your nose.  In your cheekbones.  Your sinuses and nasal passages are lined with a stringy fluid (mucus). Mucus normally drains out of your sinuses. When your nasal tissues become inflamed or swollen, the mucus can become trapped or blocked so air cannot flow through your sinuses. This allows bacteria, viruses, and funguses to grow, which leads to infection. Sinusitis can develop quickly and last for 7?10 days (acute) or for more than 12 weeks (chronic). Sinusitis often develops after a cold. What are the causes? This condition is caused by anything that creates swelling in the sinuses or stops mucus from draining, including:  Allergies.  Asthma.  Bacterial or viral infection.  Abnormally shaped bones between the nasal passages.  Nasal growths that contain mucus (nasal polyps).  Narrow sinus openings.  Pollutants, such as chemicals or irritants in the air.  A foreign object stuck in the nose.  A fungal infection. This is rare.  What increases the risk? The following factors may make you more likely to develop this condition:  Having allergies or asthma.  Having had a recent cold or respiratory tract infection.  Having structural deformities or blockages in your nose or sinuses.  Having a weak immune system.  Doing a lot of swimming or diving.  Overusing nasal sprays.  Smoking.  What are the signs or symptoms? The main symptoms of this condition are pain and a feeling of pressure around the affected sinuses. Other symptoms include:  Upper toothache.  Earache.  Headache.  Bad breath.  Decreased sense of smell and taste.  A cough that may get worse at night.  Fatigue.  Fever.  Thick drainage from your nose. The drainage is often green and  it may contain pus (purulent).  Stuffy nose or congestion.  Postnasal drip. This is when extra mucus collects in the throat or back of the nose.  Swelling and warmth over the affected sinuses.  Sore throat.  Sensitivity to light.  How is this diagnosed? This condition is diagnosed based on symptoms, a medical history, and a physical exam. To find out if your condition is acute or chronic, your health care provider may:  Look in your nose for signs of nasal polyps.  Tap over the affected sinus to check for signs of infection.  View the inside of your sinuses using an imaging device that has a light attached (endoscope).  If your health care provider suspects that you have chronic sinusitis, you may also:  Be tested for allergies.  Have a sample of mucus taken from your nose (nasal culture) and checked for bacteria.  Have a mucus sample examined to see if your sinusitis is related to an allergy.  If your sinusitis does not respond to treatment and it lasts longer than 8 weeks, you may have an MRI or CT scan to check your sinuses. These scans also help to determine how severe your infection is. In rare cases, a bone biopsy may be done to rule out more serious types of fungal sinus disease. How is this treated? Treatment for sinusitis depends on the cause and whether your condition is chronic or acute. If a virus is causing your sinusitis, your symptoms will go away on their own within 10 days. You may be given medicines to relieve your symptoms,   including:  Topical nasal decongestants. They shrink swollen nasal passages and let mucus drain from your sinuses.  Antihistamines. These drugs block inflammation that is triggered by allergies. This can help to ease swelling in your nose and sinuses.  Topical nasal corticosteroids. These are nasal sprays that ease inflammation and swelling in your nose and sinuses.  Nasal saline washes. These rinses can help to get rid of thick mucus in  your nose.  If your condition is caused by bacteria, you will be given an antibiotic medicine. If your condition is caused by a fungus, you will be given an antifungal medicine. Surgery may be needed to correct underlying conditions, such as narrow nasal passages. Surgery may also be needed to remove polyps. Follow these instructions at home: Medicines  Take, use, or apply over-the-counter and prescription medicines only as told by your health care provider. These may include nasal sprays.  If you were prescribed an antibiotic medicine, take it as told by your health care provider. Do not stop taking the antibiotic even if you start to feel better. Hydrate and Humidify  Drink enough water to keep your urine clear or pale yellow. Staying hydrated will help to thin your mucus.  Use a cool mist humidifier to keep the humidity level in your home above 50%.  Inhale steam for 10-15 minutes, 3-4 times a day or as told by your health care provider. You can do this in the bathroom while a hot shower is running.  Limit your exposure to cool or dry air. Rest  Rest as much as possible.  Sleep with your head raised (elevated).  Make sure to get enough sleep each night. General instructions  Apply a warm, moist washcloth to your face 3-4 times a day or as told by your health care provider. This will help with discomfort.  Wash your hands often with soap and water to reduce your exposure to viruses and other germs. If soap and water are not available, use hand sanitizer.  Do not smoke. Avoid being around people who are smoking (secondhand smoke).  Keep all follow-up visits as told by your health care provider. This is important. Contact a health care provider if:  You have a fever.  Your symptoms get worse.  Your symptoms do not improve within 10 days. Get help right away if:  You have a severe headache.  You have persistent vomiting.  You have pain or swelling around your face or  eyes.  You have vision problems.  You develop confusion.  Your neck is stiff.  You have trouble breathing. This information is not intended to replace advice given to you by your health care provider. Make sure you discuss any questions you have with your health care provider. Document Released: 09/10/2005 Document Revised: 05/06/2016 Document Reviewed: 07/06/2015 Elsevier Interactive Patient Education  2018 [employer].   Cough, Adult Coughing is a reflex that clears your throat and your airways. Coughing helps to heal and protect your lungs. It is normal to cough occasionally, but a cough that happens with other symptoms or lasts a long time may be a sign of a condition that needs treatment. A cough may last only 2-3 weeks (acute), or it may last longer than 8 weeks (chronic). What are the causes? Coughing is commonly caused by:  Breathing in substances that irritate your lungs.  A viral or bacterial respiratory infection.  Allergies.  Asthma.  Postnasal drip.  Smoking.  Acid backing up from the stomach into the esophagus (  gastroesophageal reflux).  Certain medicines.  Chronic lung problems, including COPD (or rarely, lung cancer).  Other medical conditions such as heart failure.  Follow these instructions at home: Pay attention to any changes in your symptoms. Take these actions to help with your discomfort:  Take medicines only as told by your health care provider. ? If you were prescribed an antibiotic medicine, take it as told by your health care provider. Do not stop taking the antibiotic even if you start to feel better. ? Talk with your health care provider before you take a cough suppressant medicine.  Drink enough fluid to keep your urine clear or pale yellow.  If the air is dry, use a cold steam vaporizer or humidifier in your bedroom or your home to help loosen secretions.  Avoid anything that causes you to cough at work or at home.  If your cough is  worse at night, try sleeping in a semi-upright position.  Avoid cigarette smoke. If you smoke, quit smoking. If you need help quitting, ask your health care provider.  Avoid caffeine.  Avoid alcohol.  Rest as needed.  Contact a health care provider if:  You have new symptoms.  You cough up pus.  Your cough does not get better after 2-3 weeks, or your cough gets worse.  You cannot control your cough with suppressant medicines and you are losing sleep.  You develop pain that is getting worse or pain that is not controlled with pain medicines.  You have a fever.  You have unexplained weight loss.  You have night sweats. Get help right away if:  You cough up blood.  You have difficulty breathing.  Your heartbeat is very fast. This information is not intended to replace advice given to you by your health care provider. Make sure you discuss any questions you have with your health care provider. Document Released: 03/09/2011 Document Revised: 02/16/2016 Document Reviewed: 11/17/2014 Elsevier Interactive Patient Education  2018 [employer].   Please take Augmentin, Flonase, and Tussionex as directed. Increase fluids/rest/vit c-2,000mg /day. Continue OTC Ibuprofen as directed by manufacturer. If symptoms persist after Augmentin completed, please call clinic. Work Excuse provided- okay to return 12/05/17. FEEL BETTER!

## 2017-12-05 DIAGNOSIS — M5136 Other intervertebral disc degeneration, lumbar region: Secondary | ICD-10-CM | POA: Insufficient documentation

## 2017-12-11 ENCOUNTER — Ambulatory Visit: Payer: Managed Care, Other (non HMO) | Admitting: Adult Health

## 2018-03-05 ENCOUNTER — Other Ambulatory Visit: Payer: Self-pay | Admitting: Adult Health

## 2018-03-12 ENCOUNTER — Other Ambulatory Visit: Payer: Self-pay | Admitting: Adult Health

## 2018-03-12 ENCOUNTER — Telehealth: Payer: Self-pay | Admitting: Adult Health

## 2018-03-12 MED ORDER — SYNTHROID 100 MCG PO TABS: 100.0000 ug | ORAL_TABLET | Freq: Every day | ORAL | 1 refills | Status: DC

## 2018-03-12 NOTE — Telephone Encounter (Signed)
Patient called about having only 30 days of thyroid meds sent to pharmacy, patient had to pay out of pocket because of amount.  Patient expressed frustration at needing to been seen every three months for a med she has been on for 15 years (per patient)  Talked with PCP Valetta Fuller Danford NP-C) and PCP was agreeable to have patient f/u every 6 months and we will cont to send in 6 mnth supply to mail order pharm  Patient agreeable  Noting this conversation for record

## 2018-03-12 NOTE — Telephone Encounter (Signed)
Noted MPulliam, CMA/RT(R)  

## 2018-08-19 ENCOUNTER — Other Ambulatory Visit: Payer: Self-pay

## 2018-08-19 DIAGNOSIS — E038 Other specified hypothyroidism: Secondary | ICD-10-CM

## 2018-08-19 DIAGNOSIS — E782 Mixed hyperlipidemia: Secondary | ICD-10-CM

## 2018-08-19 DIAGNOSIS — Z0000 Encounter for general adult medical examination without abnormal findings: Secondary | ICD-10-CM

## 2018-08-19 DIAGNOSIS — E559 Vitamin D deficiency, unspecified: Secondary | ICD-10-CM

## 2018-08-20 ENCOUNTER — Other Ambulatory Visit: Payer: Commercial Managed Care - PPO

## 2018-08-20 DIAGNOSIS — Z0000 Encounter for general adult medical examination without abnormal findings: Secondary | ICD-10-CM

## 2018-08-20 DIAGNOSIS — E038 Other specified hypothyroidism: Secondary | ICD-10-CM

## 2018-08-20 DIAGNOSIS — E559 Vitamin D deficiency, unspecified: Secondary | ICD-10-CM

## 2018-08-20 DIAGNOSIS — E782 Mixed hyperlipidemia: Secondary | ICD-10-CM

## 2018-08-21 LAB — CBC WITH DIFFERENTIAL/PLATELET
BASOS: 1 %
Basophils Absolute: 0.1 10*3/uL (ref 0.0–0.2)
EOS (ABSOLUTE): 0.1 10*3/uL (ref 0.0–0.4)
EOS: 2 %
HEMATOCRIT: 45 % (ref 34.0–46.6)
HEMOGLOBIN: 15.4 g/dL (ref 11.1–15.9)
IMMATURE GRANS (ABS): 0 10*3/uL (ref 0.0–0.1)
IMMATURE GRANULOCYTES: 0 %
LYMPHS: 44 %
Lymphocytes Absolute: 2.8 10*3/uL (ref 0.7–3.1)
MCH: 29.9 pg (ref 26.6–33.0)
MCHC: 34.2 g/dL (ref 31.5–35.7)
MCV: 87 fL (ref 79–97)
MONOCYTES: 8 %
Monocytes Absolute: 0.5 10*3/uL (ref 0.1–0.9)
NEUTROS ABS: 2.9 10*3/uL (ref 1.4–7.0)
NEUTROS PCT: 45 %
PLATELETS: 308 10*3/uL (ref 150–450)
RBC: 5.15 x10E6/uL (ref 3.77–5.28)
RDW: 12.4 % (ref 12.3–15.4)
WBC: 6.4 10*3/uL (ref 3.4–10.8)

## 2018-08-21 LAB — COMPREHENSIVE METABOLIC PANEL
A/G RATIO: 1.6 (ref 1.2–2.2)
ALT: 16 IU/L (ref 0–32)
AST: 15 IU/L (ref 0–40)
Albumin: 4.7 g/dL (ref 3.6–4.8)
Alkaline Phosphatase: 61 IU/L (ref 39–117)
BUN/Creatinine Ratio: 13 (ref 12–28)
BUN: 11 mg/dL (ref 8–27)
Bilirubin Total: 0.6 mg/dL (ref 0.0–1.2)
CALCIUM: 10.1 mg/dL (ref 8.7–10.3)
CO2: 21 mmol/L (ref 20–29)
CREATININE: 0.86 mg/dL (ref 0.57–1.00)
Chloride: 104 mmol/L (ref 96–106)
GFR calc Af Amer: 84 mL/min/{1.73_m2} (ref >59)
GFR, EST NON AFRICAN AMERICAN: 73 mL/min/{1.73_m2} (ref >59)
Globulin, Total: 3 g/dL (ref 1.5–4.5)
Glucose: 93 mg/dL (ref 65–99)
Potassium: 4.6 mmol/L (ref 3.5–5.2)
Sodium: 143 mmol/L (ref 134–144)
TOTAL PROTEIN: 7.7 g/dL (ref 6.0–8.5)

## 2018-08-21 LAB — HEMOGLOBIN A1C
ESTIMATED AVERAGE GLUCOSE: 117 mg/dL
HEMOGLOBIN A1C: 5.7 % — AB (ref 4.8–5.6)

## 2018-08-21 LAB — LIPID PANEL
Chol/HDL Ratio: 3.8 ratio (ref 0.0–4.4)
Cholesterol, Total: 187 mg/dL (ref 100–199)
HDL: 49 mg/dL (ref >39)
LDL Calculated: 113 mg/dL — ABNORMAL HIGH (ref 0–99)
Triglycerides: 124 mg/dL (ref 0–149)
VLDL Cholesterol Cal: 25 mg/dL (ref 5–40)

## 2018-08-21 LAB — TSH: TSH: 2.83 u[IU]/mL (ref 0.450–4.500)

## 2018-08-21 LAB — VITAMIN D 25 HYDROXY (VIT D DEFICIENCY, FRACTURES): VIT D 25 HYDROXY: 19.8 ng/mL — AB (ref 30.0–100.0)

## 2018-08-22 ENCOUNTER — Other Ambulatory Visit: Payer: Self-pay | Admitting: Adult Health

## 2018-08-22 MED ORDER — VITAMIN D (ERGOCALCIFEROL) 1.25 MG (50000 UNIT) PO CAPS: ORAL_CAPSULE | ORAL | 0 refills | Status: DC

## 2018-08-25 NOTE — Progress Notes (Signed)
Subjective:    Patient ID: [name], female    DOB: 1956-07-23, 62 y.o.   MRN: [id number]  HPI:  [name] is here to review recent labs and discuss insomnia. The 10-year ASCVD risk score [name], et al., 2013) is: 2.7%   Values used to calculate the score:     Age: 62 years     Sex: Female     Is Non-Hispanic African American: No     Diabetic: No     Tobacco smoker: No     Systolic Blood Pressure: 616 mmHg     Is BP treated: No     HDL Cholesterol: 49 mg/dL     Total Cholesterol: 187 mg/dL TSH-WNL 2.380 Vit d- 19.8, ergocalciferol rx sent in She has tried OTC Melatonin, and various holistic essential oils for insomnia with only minimal sx relief. She is interested in trying OTC GABA for improving sleep. She reports walking >2 miles/day but eats a diet high in CHO She has gained >8 since last OV 12/02/17 She continues to abstain from tobacco/vape/EOTH use  Patient Care Team    Relationship Specialty Notifications Start End  Mina Marble D, NP PCP - General Family Medicine  09/05/17   Aplington, Laurice Record, MD (Inactive) Consulting Physician Orthopedic Surgery  06/04/16    Comment: L THR in Feb 2013  Paralee Cancel, MD Consulting Physician Orthopedic Surgery  06/04/16   Arta Silence, MD Consulting Physician Gastroenterology  06/04/16    Comment: Hep C txmnt  Druscilla Brownie, MD Consulting Physician Dermatology  06/04/16   Landis Martins, DPM Consulting Physician Podiatry  06/04/16     Patient Active Problem List   Diagnosis Date Noted  . Elevated LDL cholesterol level 08/27/2018  . Acute maxillary sinusitis 12/02/2017  . Cough in adult 12/02/2017  . Weakness of both lower extremities 09/05/2017  . Healthcare maintenance 09/05/2017  . Screening for cervical cancer 01/07/2017  . Vitamin D deficiency 07/01/2016  . Hypercholesterolemia with hypertriglyceridemia 06/26/2016  . Hypothyroidism 06/04/2016  . Nail fungus 06/04/2016  . History of L hip  replacement 06/04/2016  . Insomnia 06/04/2016  . Feeling exhausted 06/04/2016  . Muscle fatigue 06/04/2016  . Hepatitis C infection 06/04/2016     Past Medical History:  Diagnosis Date  . Arthritis   . Blood transfusion    1968  . Blood transfusion without reported diagnosis   . Cancer ([place])    cervical  . Complication of anesthesia    sensitive to all meds- takes longer to metabolize  . 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 Hx of BLACK WIDOW SPIDER BITE x 2 left shoulder 10/11 with  "knot at base of scull"   seen by PCP- no neuro visit- resolved  . 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 Hypoglycemia   . Hypothyroidism      Past Surgical History:  Procedure Laterality Date  . CERVICAL CONE BIOPSY    . COLONOSCOPY    . EYE SURGERY     Lasic- left eye  . TONSILLECTOMY    . TOTAL HIP ARTHROPLASTY  10/30/2011   Procedure: TOTAL HIP ARTHROPLASTY;  Surgeon: Magnus Sinning, MD;  Location: [facility];  Service: Orthopedics;  Laterality: Left;     Family History  Adopted: Yes     Social History   Substance and Sexual Activity  Drug Use No   Comment: marijuana, cocaine in college     Social History  Substance and Sexual Activity  Alcohol Use Yes   Comment: 1-2/ monthly     Social History   Tobacco Use  Smoking Status Former Smoker  . Packs/day: 0.50  . Years: 38.00  . Pack years: 19.00  . Types: Cigarettes  . Last attempt to quit: 03/24/2016  . Years since quitting: 2.4  Smokeless Tobacco Never Used  Tobacco Comment   5-6 cigarettes day     Outpatient Encounter Medications as of 08/27/2018  Medication Sig  . Calcium 500 MG CHEW Chew 1 each by mouth at bedtime.  . Probiotic Product (PROBIOTIC FORMULA PO) Take 1 capsule by mouth daily.  Marland Kitchen SYNTHROID 100 MCG tablet Take 1 tablet (100 mcg total) by mouth daily. PATIENT NEEDS OFFICE VISIT PRIOR TO ANY FURTHER REFILLS  . Vitamin D, Ergocalciferol, (DRISDOL) 1.25 MG (50000 UT) CAPS  capsule TAKE 1 CAPSULE EVERY 7 DAYS (Patient not taking: Reported on 08/27/2018)  . [DISCONTINUED] amoxicillin-clavulanate (AUGMENTIN) 875-125 MG tablet Take 1 tablet by mouth 2 (two) times daily.  . [DISCONTINUED] chlorpheniramine-HYDROcodone (TUSSIONEX) 10-8 MG/5ML SUER Take 5 mLs by mouth every 12 (twelve) hours as needed for cough.  . [DISCONTINUED] fluticasone (FLONASE) 50 MCG/ACT nasal spray Place 2 sprays into both nostrils daily.  . [DISCONTINUED] OVER THE COUNTER MEDICATION Take 1 tablet by mouth 2 (two) times daily.   No facility-administered encounter medications on file as of 08/27/2018.     Allergies: Patient has no known allergies.  Body mass index is 26.64 kg/m.  Blood pressure 109/72, pulse 74, temperature 98.5 F (36.9 C), temperature source Oral, height 5' 8.25" (1.734 m), weight 176 lb 8 oz (80.1 kg), last menstrual period 10/29/2000, SpO2 97 %.  Review of Systems  Constitutional: Positive for fatigue. Negative for activity change, appetite change, chills, diaphoresis, fever and unexpected weight change.  Eyes: Negative for visual disturbance.  Respiratory: Negative for cough, chest tightness, shortness of breath, wheezing and stridor.   Cardiovascular: Negative for chest pain, palpitations and leg swelling.  Gastrointestinal: Negative for abdominal distention, abdominal pain, anal bleeding, blood in stool, constipation, diarrhea, nausea, rectal pain and vomiting.  Neurological: Negative for dizziness and headaches.  Hematological: Does not bruise/bleed easily.  Psychiatric/Behavioral: Positive for sleep disturbance. Negative for behavioral problems, confusion, decreased concentration, dysphoric mood, hallucinations, self-injury and suicidal ideas. The patient is not nervous/anxious and is not hyperactive.        Objective:   Physical Exam  Constitutional: She is oriented to person, place, and time. She appears well-developed and well-nourished. No distress.  HENT:   Head: Normocephalic and atraumatic.  Right Ear: External ear normal.  Left Ear: External ear normal.  Nose: Nose normal.  Mouth/Throat: Oropharynx is clear and moist.  Eyes: Pupils are equal, round, and reactive to light. Conjunctivae and EOM are normal.  Cardiovascular: Normal rate, regular rhythm, normal heart sounds and intact distal pulses.  No murmur heard. Pulmonary/Chest: Effort normal and breath sounds normal. No stridor. No respiratory distress. She has no wheezes. She has no rales. She exhibits no tenderness.  Neurological: She is alert and oriented to person, place, and time.  Skin: Skin is warm and dry. Capillary refill takes less than 2 seconds. No rash noted. She is not diaphoretic. There is erythema. No pallor.  Psychiatric: She has a normal mood and affect. Her behavior is normal. Judgment and thought content normal.  Nursing note and vitals reviewed.     Assessment & Plan:   1. Healthcare maintenance   2. Other specified hypothyroidism  3. Elevated LDL cholesterol level     Healthcare maintenance  Overall your labs and blood pressure look good! Recommend to increase regular exercise and follow Mediterranean diet to help with weight loss. Continue all medications as directed. Recommend OTC Vit d 2,000 once daily. Please schedule complete physical in 6 months.  Hypothyroidism 08/20/18  2.830 Continue Levothyroxine 111mcg QD  Elevated LDL cholesterol level 08/20/18  The 10-year ASCVD risk score Mikey Bussing DC Jr., et al., 2013) is: 2.7%   Values used to calculate the score:     Age: 46 years     Sex: Female     Is Non-Hispanic African American: No     Diabetic: No     Tobacco smoker: No     Systolic Blood Pressure: 443 mmHg     Is BP treated: No     HDL Cholesterol: 49 mg/dL     Total Cholesterol: 187 mg/dL LDL 113    FOLLOW-UP:  Return in about 6 months (around 02/26/2019) for CPE.

## 2018-08-27 ENCOUNTER — Ambulatory Visit (INDEPENDENT_AMBULATORY_CARE_PROVIDER_SITE_OTHER): Payer: Commercial Managed Care - PPO | Admitting: Adult Health

## 2018-08-27 VITALS — BP 109/72 | HR 74 | Temp 98.5°F | Ht 68.25 in | Wt 176.5 lb

## 2018-08-27 DIAGNOSIS — E038 Other specified hypothyroidism: Secondary | ICD-10-CM | POA: Diagnosis not present

## 2018-08-27 DIAGNOSIS — E7800 Pure hypercholesterolemia, unspecified: Secondary | ICD-10-CM | POA: Insufficient documentation

## 2018-08-27 DIAGNOSIS — Z0000 Encounter for general adult medical examination without abnormal findings: Secondary | ICD-10-CM | POA: Diagnosis not present

## 2018-08-27 NOTE — Assessment & Plan Note (Signed)
08/20/18  2.830 Continue Levothyroxine 152mcg QD

## 2018-08-27 NOTE — Assessment & Plan Note (Signed)
  Overall your labs and blood pressure look good! Recommend to increase regular exercise and follow Mediterranean diet to help with weight loss. Continue all medications as directed. Recommend OTC Vit d 2,000 once daily. Please schedule complete physical in 6 months.

## 2018-08-27 NOTE — Patient Instructions (Signed)
Mediterranean Diet A Mediterranean diet refers to food and lifestyle choices that are based on the traditions of countries located on the [place] Sea. This way of eating has been shown to help prevent certain conditions and improve outcomes for people who have chronic diseases, like kidney disease and heart disease. What are tips for following this plan? Lifestyle  Cook and eat meals together with your family, when possible.  Drink enough fluid to keep your urine clear or pale yellow.  Be physically active every day. This includes: ? Aerobic exercise like running or swimming. ? Leisure activities like gardening, walking, or housework.  Get 7-8 hours of sleep each night.  If recommended by your health care provider, drink red wine in moderation. This means 1 glass a day for nonpregnant women and 2 glasses a day for men. A glass of wine equals 5 oz (150 mL). Reading food labels  Check the serving size of packaged foods. For foods such as rice and pasta, the serving size refers to the amount of cooked product, not dry.  Check the total fat in packaged foods. Avoid foods that have saturated fat or trans fats.  Check the ingredients list for added sugars, such as corn syrup. Shopping  At the grocery store, buy most of your food from the areas near the walls of the store. This includes: ? Fresh fruits and vegetables (produce). ? Grains, beans, nuts, and seeds. Some of these may be available in unpackaged forms or large amounts (in bulk). ? Fresh seafood. ? Poultry and eggs. ? Low-fat dairy products.  Buy whole ingredients instead of prepackaged foods.  Buy fresh fruits and vegetables in-season from local farmers markets.  Buy frozen fruits and vegetables in resealable bags.  If you do not have access to quality fresh seafood, buy precooked frozen shrimp or canned fish, such as tuna, salmon, or sardines.  Buy small amounts of raw or cooked vegetables, salads, or olives from the  deli or salad bar at your store.  Stock your pantry so you always have certain foods on hand, such as olive oil, canned tuna, canned tomatoes, rice, pasta, and beans. Cooking  Cook foods with extra-virgin olive oil instead of using butter or other vegetable oils.  Have meat as a side dish, and have vegetables or grains as your main dish. This means having meat in small portions or adding small amounts of meat to foods like pasta or stew.  Use beans or vegetables instead of meat in common dishes like chili or lasagna.  Experiment with different cooking methods. Try roasting or broiling vegetables instead of steaming or sauteing them.  Add frozen vegetables to soups, stews, pasta, or rice.  Add nuts or seeds for added healthy fat at each meal. You can add these to yogurt, salads, or vegetable dishes.  Marinate fish or vegetables using olive oil, lemon juice, garlic, and fresh herbs. Meal planning  Plan to eat 1 vegetarian meal one day each week. Try to work up to 2 vegetarian meals, if possible.  Eat seafood 2 or more times a week.  Have healthy snacks readily available, such as: ? Vegetable sticks with hummus. ? Greek yogurt. ? Fruit and nut trail mix.  Eat balanced meals throughout the week. This includes: ? Fruit: 2-3 servings a day ? Vegetables: 4-5 servings a day ? Low-fat dairy: 2 servings a day ? Fish, poultry, or lean meat: 1 serving a day ? Beans and legumes: 2 or more servings a week ? Nuts   and seeds: 1-2 servings a day ? Whole grains: 6-8 servings a day ? Extra-virgin olive oil: 3-4 servings a day  Limit red meat and sweets to only a few servings a month What are my food choices?  Mediterranean diet ? Recommended ? Grains: Whole-grain pasta. Brown rice. Bulgar wheat. Polenta. Couscous. Whole-wheat bread. [place]. ? Vegetables: Artichokes. Beets. Broccoli. Cabbage. Carrots. Eggplant. Green beans. Chard. Kale. Spinach. Onions. Leeks. Peas. Squash.  Tomatoes. Peppers. Radishes. ? Fruits: Apples. Apricots. Avocado. Berries. Bananas. Cherries. Dates. Figs. Grapes. Lemons. Melon. Oranges. Peaches. Plums. Pomegranate. ? Meats and other protein foods: Beans. Almonds. Sunflower seeds. Pine nuts. Peanuts. [place]. Salmon. Scall[place]. Shrimp. [place]. Tilapia. Clams. Oysters. Eggs. ? Dairy: Low-fat milk. Cheese. Greek yogurt. ? Beverages: Water. Red wine. Herbal tea. ? Fats and oils: Extra virgin olive oil. Avocado oil. Grape seed oil. ? Sweets and desserts: [place] yogurt with honey. Baked apples. Poached pears. Trail mix. ? Seasoning and other foods: Basil. Cilantro. Coriander. Cumin. Mint. Parsley. Sage. Rosemary. Tarragon. Garlic. Oregano. Thyme. Pepper. Balsalmic vinegar. Tahini. Hummus. Tomato sauce. Olives. Mushrooms. ? Limit these ? Grains: Prepackaged pasta or rice dishes. Prepackaged cereal with added sugar. ? Vegetables: Deep fried potatoes (french fries). ? Fruits: Fruit canned in syrup. ? Meats and other protein foods: Beef. Pork. Lamb. Poultry with skin. Hot dogs. Berniece Salines. ? Dairy: Ice cream. Sour cream. Whole milk. ? Beverages: Juice. Sugar-sweetened soft drinks. Beer. Liquor and spirits. ? Fats and oils: Butter. Canola oil. Vegetable oil. Beef fat (tallow). Lard. ? Sweets and desserts: Cookies. Cakes. Pies. Candy. ? Seasoning and other foods: Mayonnaise. Premade sauces and marinades. ? The items listed may not be a complete list. Talk with your dietitian about what dietary choices are right for you. Summary  The Mediterranean diet includes both food and lifestyle choices.  Eat a variety of fresh fruits and vegetables, beans, nuts, seeds, and whole grains.  Limit the amount of red meat and sweets that you eat.  Talk with your health care provider about whether it is safe for you to drink red wine in moderation. This means 1 glass a day for nonpregnant women and 2 glasses a day for men. A glass of wine equals 5 oz (150 mL). This information  is not intended to replace advice given to you by your health care provider. Make sure you discuss any questions you have with your health care provider. Document Released: 05/03/2016 Document Revised: 06/05/2016 Document Reviewed: 05/03/2016 Elsevier Interactive Patient Education  2018 [employer].   Overall your labs and blood pressure look good! Recommend to increase regular exercise and follow Mediterranean diet to help with weight loss. Continue all medications as directed. Recommend OTC Vit d 2,000 once daily. Please schedule complete physical in 6 months. GREAT TO SEE YOU!

## 2018-08-27 NOTE — Assessment & Plan Note (Signed)
08/20/18  The 10-year ASCVD risk score [name], et al., 2013) is: 2.7%   Values used to calculate the score:     Age: 62 years     Sex: Female     Is Non-Hispanic African American: No     Diabetic: No     Tobacco smoker: No     Systolic Blood Pressure: 225 mmHg     Is BP treated: No     HDL Cholesterol: 49 mg/dL     Total Cholesterol: 187 mg/dL LDL 113

## 2018-09-09 ENCOUNTER — Ambulatory Visit (INDEPENDENT_AMBULATORY_CARE_PROVIDER_SITE_OTHER): Payer: Commercial Managed Care - PPO | Admitting: Certified Nurse Midwife

## 2018-09-09 ENCOUNTER — Encounter: Payer: Self-pay | Admitting: Certified Nurse Midwife

## 2018-09-09 ENCOUNTER — Other Ambulatory Visit (HOSPITAL_COMMUNITY)
Admission: RE | Admit: 2018-09-09 | Discharge: 2018-09-09 | Disposition: A | Discharge status: Home / Self Care | Payer: Commercial Managed Care - PPO | Source: Ambulatory Visit | Attending: Certified Nurse Midwife | Admitting: Certified Nurse Midwife

## 2018-09-09 ENCOUNTER — Other Ambulatory Visit: Payer: Self-pay

## 2018-09-09 VITALS — BP 118/80 | HR 72 | Resp 16 | Ht 67.5 in | Wt 175.0 lb

## 2018-09-09 DIAGNOSIS — Z8742 Personal history of other diseases of the female genital tract: Secondary | ICD-10-CM | POA: Diagnosis not present

## 2018-09-09 DIAGNOSIS — N952 Postmenopausal atrophic vaginitis: Secondary | ICD-10-CM

## 2018-09-09 DIAGNOSIS — Z01411 Encounter for gynecological examination (general) (routine) with abnormal findings: Secondary | ICD-10-CM | POA: Diagnosis not present

## 2018-09-09 DIAGNOSIS — Z124 Encounter for screening for malignant neoplasm of cervix: Secondary | ICD-10-CM | POA: Insufficient documentation

## 2018-09-09 NOTE — Progress Notes (Signed)
62 y.o. G0P0000 Married  Caucasian Fe here to establish gyn care and for annual exam. Sees PCP Dr. Eudelia Bunch for labs and hypothyroid/Vitamin D medication management. Continues to have Vitamin D deficiency on supplement now. Patient  is non smoker for 3 years now!.  History of Abnormal pap smear in 1980 with cone biopsy and laser conization in 1990's with another abnormal pap smear. Complaining of pain with intercourse and not sure what the issue is. Some dryness, does not use lubrication or moisture routinely. Was not aware mammogram has not been done. Working on weight change due to gain in the past few years. No other health issues today.  Patient's last menstrual period was 10/29/2000.          Sexually active: No.  The current method of family planning is post menopausal status.    Exercising: Yes.    walking Smoker:  no  Review of Systems  Constitutional: Negative.   HENT: Negative.   Eyes: Negative.   Respiratory: Negative.   Cardiovascular: Negative.   Gastrointestinal: Negative.   Genitourinary:       Pain with intercourse  Musculoskeletal: Negative.   Skin: Negative.   Neurological: Negative.   Endo/Heme/Allergies: Negative.   Psychiatric/Behavioral: Negative.     Health Maintenance: Pap:  01-07-17 neg History of Abnormal Pap: yes MMG:  05-25-16 category c density birads 1:neg Self Breast exams: yes Colonoscopy:  Not yet BMD:   Unsure will check with PCP TDaP:  2018 Shingles: no Pneumonia: no Hep C and HIV: hep c neg 2018, previous + Labs: if needed   reports that she quit smoking about 2 years ago. Her smoking use included cigarettes. She has a 19.00 pack-year smoking history. She has never used smokeless tobacco. She reports current alcohol use. She reports that she does not use drugs.  Past Medical History:  Diagnosis Date  . Abnormal Pap smear of cervix   . Arthritis   . Blood transfusion    1968  . Blood transfusion without reported diagnosis   . Cancer ([place])     cervical  . Complication of anesthesia    sensitive to all meds- takes longer to metabolize  . 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 Hx of BLACK WIDOW SPIDER BITE x 2 left shoulder 10/11 with  "knot at base of scull"   seen by PCP- no neuro visit- resolved  . 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 Hypoglycemia   . Hypothyroidism     Past Surgical History:  Procedure Laterality Date  . CERVICAL CONE BIOPSY    . COLONOSCOPY    . EYE SURGERY     Lasic- left eye  . TONSILLECTOMY    . TOTAL HIP ARTHROPLASTY  10/30/2011   Procedure: TOTAL HIP ARTHROPLASTY;  Surgeon: Magnus Sinning, MD;  Location: [facility];  Service: Orthopedics;  Laterality: Left;    Current Outpatient Medications  Medication Sig Dispense Refill  . Calcium 500 MG CHEW Chew 1 each by mouth at bedtime.    . Probiotic Product (PROBIOTIC FORMULA PO) Take 1 capsule by mouth daily.    Marland Kitchen SYNTHROID 100 MCG tablet Take 1 tablet (100 mcg total) by mouth daily. PATIENT NEEDS OFFICE VISIT PRIOR TO ANY FURTHER REFILLS 90 tablet 1  . Vitamin D, Ergocalciferol, (DRISDOL) 1.25 MG (50000 UT) CAPS capsule TAKE 1 CAPSULE EVERY 7 DAYS 16 capsule 0   No current facility-administered medications for this visit.  Family History  Adopted: Yes    ROS:  Pertinent items are noted in HPI.  Otherwise, a comprehensive ROS was negative.  Exam:   BP 118/80   Pulse 72   Resp 16   Ht 5' 7.5" (1.715 m)   Wt 175 lb (79.4 kg)   LMP 10/29/2000   BMI 27.00 kg/m  Height: 5' 7.5" (171.5 cm) Ht Readings from Last 3 Encounters:  09/09/18 5' 7.5" (1.715 m)  08/27/18 5' 8.25" (1.734 m)  12/02/17 5' 8.25" (1.734 m)    General appearance: alert, cooperative and appears stated age Head: Normocephalic, without obvious abnormality, atraumatic Neck: no adenopathy, supple, symmetrical, trachea midline and thyroid normal to inspection and palpation Lungs: clear to auscultation bilaterally Breasts: normal appearance, no  masses or tenderness, No nipple retraction or dimpling, No nipple discharge or bleeding, No axillary or supraclavicular adenopathy Heart: regular rate and rhythm Abdomen: soft, non-tender; no masses,  no organomegaly Extremities: extremities normal, atraumatic, no cyanosis or edema Skin: Skin color, texture, turgor normal. No rashes or lesions Lymph nodes: Cervical, supraclavicular, and axillary nodes normal. No abnormal inguinal nodes palpated Neurologic: Grossly normal   Pelvic: External genitalia:  no lesions, normal female              Urethra:  normal appearing urethra with no masses, tenderness or lesions              Bartholin's and Skene's: normal                 Vagina: atrophic appearing vagina in posterior and anterior area of vagina with slight discomfort when touching, with normal color and scant discharge, no lesions              Cervix: no cervical motion tenderness, no lesions and stenotic os with 3 attempts for endocervical cells              Pap taken: Yes.   Bimanual Exam:  Uterus:  normal size, contour, position, consistency, mobility, non-tender and anteverted              Adnexa: normal adnexa and no mass, fullness, tenderness               Rectovaginal: Confirms               Anus:  normal sphincter tone, no lesions  Chaperone present: yes  A:  Well Woman with normal exam  Menopausal no HRT  Atrophic vaginitis  History of abnormal pap smear with CKC  1980 and cone laser in 1990's.  Mammogram over due  Colonoscopy due  Hypothyroid, Vitamin D deficiency with PCP management  P:   Reviewed health and wellness pertinent to exam  Aware of need to advise if vaginal bleeding  Discussed atrophic vaginitis and etiology of tissue seen. Discussed Coconut  Or Olive oil consistent use for discomfort with sexual activity. Discussed estrogen use and prefers none. Patient will advise if no change.  Stressed importance of pap smear.  Patient will schedule with [facility]  today  Discussed risks/benefits of colonoscopy, patient given Dr.Mann information and will do her research and call here if desires referral there or through PCP.  Continue follow up with PCP as indicated  Pap smear: yes   counseled on breast self exam, mammography screening, feminine hygiene, adequate intake of calcium and vitamin D, diet and exercise  return annually or prn  An After Visit Summary was printed and given to the patient.

## 2018-09-09 NOTE — Patient Instructions (Signed)

## 2018-09-15 LAB — CYTOLOGY - PAP
Diagnosis: NEGATIVE
HPV (WINDOPATH): NOT DETECTED

## 2018-09-22 ENCOUNTER — Other Ambulatory Visit: Payer: Self-pay | Admitting: Adult Health

## 2018-09-22 MED ORDER — SYNTHROID 100 MCG PO TABS: ORAL_TABLET | ORAL | 1 refills | Status: DC

## 2018-09-22 NOTE — Addendum Note (Signed)
Addended by: Fonnie Mu on: 09/22/2018 05:09 PM   Modules accepted: Orders

## 2018-09-23 ENCOUNTER — Other Ambulatory Visit: Payer: Self-pay | Admitting: Adult Health

## 2019-02-24 ENCOUNTER — Other Ambulatory Visit: Payer: Self-pay | Admitting: Adult Health

## 2019-02-24 DIAGNOSIS — Z1231 Encounter for screening mammogram for malignant neoplasm of breast: Secondary | ICD-10-CM

## 2019-02-25 ENCOUNTER — Ambulatory Visit
Admission: RE | Admit: 2019-02-25 | Discharge: 2019-02-25 | Disposition: A | Discharge status: Home / Self Care | Payer: Commercial Managed Care - PPO | Source: Ambulatory Visit | Attending: Adult Health | Admitting: Adult Health

## 2019-02-25 ENCOUNTER — Other Ambulatory Visit: Payer: Self-pay

## 2019-02-25 DIAGNOSIS — Z1231 Encounter for screening mammogram for malignant neoplasm of breast: Secondary | ICD-10-CM

## 2019-04-04 ENCOUNTER — Other Ambulatory Visit: Payer: Self-pay | Admitting: Adult Health

## 2019-04-06 ENCOUNTER — Telehealth: Payer: Self-pay

## 2019-04-06 NOTE — Telephone Encounter (Signed)
Please call pt to schedule CPE.  No further refills until pt is seen in office.  Charyl Bigger, CMA

## 2019-04-13 ENCOUNTER — Telehealth: Payer: Self-pay | Admitting: Adult Health

## 2019-04-13 MED ORDER — SYNTHROID 100 MCG PO TABS: ORAL_TABLET | ORAL | 0 refills | Status: DC

## 2019-04-13 NOTE — Telephone Encounter (Signed)
Pt informed of RX.  T. Sandro Burgo, CMA 

## 2019-04-13 NOTE — Addendum Note (Signed)
Addended by: Fonnie Mu on: 04/13/2019 03:16 PM   Modules accepted: Orders

## 2019-04-13 NOTE — Telephone Encounter (Signed)
Patient knows she is due for a CPE and has scheduled one for [facility] first available which is not until 05/25/19. She was given a short supply of her thyroid meds and will be out prior to this appt. Can we issue another refill for this patient and if so please send to [facility].

## 2019-05-20 ENCOUNTER — Other Ambulatory Visit: Payer: Self-pay

## 2019-05-20 ENCOUNTER — Other Ambulatory Visit: Payer: Commercial Managed Care - PPO

## 2019-05-20 DIAGNOSIS — E559 Vitamin D deficiency, unspecified: Secondary | ICD-10-CM

## 2019-05-20 DIAGNOSIS — E039 Hypothyroidism, unspecified: Secondary | ICD-10-CM

## 2019-05-20 DIAGNOSIS — Z0000 Encounter for general adult medical examination without abnormal findings: Secondary | ICD-10-CM

## 2019-05-20 DIAGNOSIS — E782 Mixed hyperlipidemia: Secondary | ICD-10-CM

## 2019-05-20 DIAGNOSIS — M6289 Other specified disorders of muscle: Secondary | ICD-10-CM

## 2019-05-21 ENCOUNTER — Encounter: Payer: Commercial Managed Care - PPO | Admitting: Adult Health

## 2019-05-21 LAB — COMPREHENSIVE METABOLIC PANEL
ALT: 18 IU/L (ref 0–32)
AST: 15 IU/L (ref 0–40)
Albumin/Globulin Ratio: 2 (ref 1.2–2.2)
Albumin: 4.8 g/dL (ref 3.8–4.8)
Alkaline Phosphatase: 61 IU/L (ref 39–117)
BUN/Creatinine Ratio: 17 (ref 12–28)
BUN: 16 mg/dL (ref 8–27)
Bilirubin Total: 0.7 mg/dL (ref 0.0–1.2)
CO2: 25 mmol/L (ref 20–29)
Calcium: 10.3 mg/dL (ref 8.7–10.3)
Chloride: 102 mmol/L (ref 96–106)
Creatinine, Ser: 0.93 mg/dL (ref 0.57–1.00)
GFR calc Af Amer: 76 mL/min/{1.73_m2} (ref >59)
GFR calc non Af Amer: 66 mL/min/{1.73_m2} (ref >59)
Globulin, Total: 2.4 g/dL (ref 1.5–4.5)
Glucose: 98 mg/dL (ref 65–99)
Potassium: 5.3 mmol/L — ABNORMAL HIGH (ref 3.5–5.2)
Sodium: 142 mmol/L (ref 134–144)
Total Protein: 7.2 g/dL (ref 6.0–8.5)

## 2019-05-21 LAB — CBC WITH DIFFERENTIAL/PLATELET
Basophils Absolute: 0.1 10*3/uL (ref 0.0–0.2)
Basos: 1 %
EOS (ABSOLUTE): 0.1 10*3/uL (ref 0.0–0.4)
Eos: 2 %
Hematocrit: 44.5 % (ref 34.0–46.6)
Hemoglobin: 15.4 g/dL (ref 11.1–15.9)
Immature Grans (Abs): 0 10*3/uL (ref 0.0–0.1)
Immature Granulocytes: 0 %
Lymphocytes Absolute: 2.8 10*3/uL (ref 0.7–3.1)
Lymphs: 41 %
MCH: 29.9 pg (ref 26.6–33.0)
MCHC: 34.6 g/dL (ref 31.5–35.7)
MCV: 86 fL (ref 79–97)
Monocytes Absolute: 0.6 10*3/uL (ref 0.1–0.9)
Monocytes: 9 %
Neutrophils Absolute: 3.3 10*3/uL (ref 1.4–7.0)
Neutrophils: 47 %
Platelets: 313 10*3/uL (ref 150–450)
RBC: 5.15 x10E6/uL (ref 3.77–5.28)
RDW: 11.9 % (ref 11.7–15.4)
WBC: 6.9 10*3/uL (ref 3.4–10.8)

## 2019-05-21 LAB — HEMOGLOBIN A1C
Est. average glucose Bld gHb Est-mCnc: 117 mg/dL
Hgb A1c MFr Bld: 5.7 % — ABNORMAL HIGH (ref 4.8–5.6)

## 2019-05-21 LAB — LIPID PANEL
Chol/HDL Ratio: 4.2 ratio (ref 0.0–4.4)
Cholesterol, Total: 191 mg/dL (ref 100–199)
HDL: 46 mg/dL (ref >39)
LDL Calculated: 115 mg/dL — ABNORMAL HIGH (ref 0–99)
Triglycerides: 152 mg/dL — ABNORMAL HIGH (ref 0–149)
VLDL Cholesterol Cal: 30 mg/dL (ref 5–40)

## 2019-05-21 LAB — TSH: TSH: 3.11 u[IU]/mL (ref 0.450–4.500)

## 2019-05-21 LAB — VITAMIN D 25 HYDROXY (VIT D DEFICIENCY, FRACTURES): Vit D, 25-Hydroxy: 26.2 ng/mL — ABNORMAL LOW (ref 30.0–100.0)

## 2019-05-22 ENCOUNTER — Other Ambulatory Visit: Payer: Self-pay | Admitting: Adult Health

## 2019-05-22 MED ORDER — VITAMIN D (ERGOCALCIFEROL) 1.25 MG (50000 UNIT) PO CAPS: ORAL_CAPSULE | ORAL | 0 refills | Status: DC

## 2019-05-24 NOTE — Progress Notes (Signed)
Subjective:    Patient ID: [name], female    DOB: Feb 10, 1956, 63 y.o.   MRN: [id number]  HPI:  [name] is here CPE She reports struggling with diet/exercise since March r/t to pandemic She has been much more sedentary than normal Current wt 179, wt in Dec 2019- 176 Body mass index is 27.56 kg/m.  She continues to abstain from tobacco/vape/ETOH use  05/20/2019 Labs- Vit D-low, 26.2- sent in another Rx of Ergocalciferol  Re-check level in 4 months  TSH-WNL, 3.110  The 10-year ASCVD risk score [name] [name]., et al., 2013) is: 3.7%  Values used to calculate the score:   Age: 62 years   Sex: Female   Is Non-Hispanic African American: No   Diabetic: No   Tobacco smoker: No   Systolic Blood Pressure: 123456 mmHg   Is BP treated: No   HDL Cholesterol: 46 mg/dL   Total Cholesterol: 191 mg/dL  LDL-115  A1c-5.7  CBC-stable  CMP-K+ 5.3   Healthcare Maintenance: PAP-UTD, last 09/09/2018- normal Mammogram-UTD, last 03/24/2019-normal Colonoscopy-Ordered  Immunizations-UTD  Patient Care Team    Relationship Specialty Notifications Start End  Esaw Grandchild, NP PCP - General Family Medicine  09/05/17   Aplington, Laurice Record, MD (Inactive) Consulting Physician Orthopedic Surgery  06/04/16    Comment: L THR in Feb 2013  Paralee Cancel, MD Consulting Physician Orthopedic Surgery  06/04/16   Arta Silence, MD Consulting Physician Gastroenterology  06/04/16    Comment: Hep C txmnt  Druscilla Brownie, MD Consulting Physician Dermatology  06/04/16   Landis Martins, DPM Consulting Physician Podiatry  06/04/16     Patient Active Problem List   Diagnosis Date Noted  . Elevated LDL cholesterol level 08/27/2018  . Acute maxillary sinusitis 12/02/2017  . Cough in adult 12/02/2017  . Weakness of both lower extremities 09/05/2017  . Healthcare maintenance 09/05/2017  . Screening for cervical cancer 01/07/2017  . Vitamin D deficiency 07/01/2016  .  Hypercholesterolemia with hypertriglyceridemia 06/26/2016  . Hypothyroidism 06/04/2016  . Nail fungus 06/04/2016  . History of L hip replacement 06/04/2016  . Insomnia 06/04/2016  . Feeling exhausted 06/04/2016  . Muscle fatigue 06/04/2016  . Hepatitis C infection 06/04/2016     Past Medical History:  Diagnosis Date  . Abnormal Pap smear of cervix   . Arthritis   . Blood transfusion    1968  . Blood transfusion without reported diagnosis   . Cancer ([place])    cervical  . Complication of anesthesia    sensitive to all meds- takes longer to metabolize  . 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 Hx of BLACK WIDOW SPIDER BITE x 2 left shoulder 10/11 with  "knot at base of scull"   seen by PCP- no neuro visit- resolved  . 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 Hypoglycemia   . Hypothyroidism      Past Surgical History:  Procedure Laterality Date  . CERVICAL CONE BIOPSY    . COLONOSCOPY    . EYE SURGERY     Lasic- left eye  . laser conization    . TONSILLECTOMY    . TOTAL HIP ARTHROPLASTY  10/30/2011   Procedure: TOTAL HIP ARTHROPLASTY;  Surgeon: Magnus Sinning, MD;  Location: [facility];  Service: Orthopedics;  Laterality: Left;     Family History  Adopted: Yes     Social History   Substance and Sexual Activity  Drug Use No  Comment: marijuana/cocaine use in college     Social History   Substance and Sexual Activity  Alcohol Use Yes     Social History   Tobacco Use  Smoking Status Former Smoker  . Packs/day: 0.50  . Years: 38.00  . Pack years: 19.00  . Types: Cigarettes  . Quit date: 03/24/2016  . Years since quitting: 3.1  Smokeless Tobacco Never Used  Tobacco Comment   5-6 cigarettes day     Outpatient Encounter Medications as of 05/25/2019  Medication Sig  . Calcium 500 MG CHEW Chew 1 each by mouth at bedtime.  . Probiotic Product (PROBIOTIC FORMULA PO) Take 1 capsule by mouth daily.  Marland Kitchen SYNTHROID 100 MCG tablet TAKE 1  TABLET DAILY, MAKE  AN APPOINTMENT FOR FURTHER REFILLS  . Vitamin D, Ergocalciferol, (DRISDOL) 1.25 MG (50000 UT) CAPS capsule TAKE 1 CAPSULE EVERY 7 DAYS   No facility-administered encounter medications on file as of 05/25/2019.     Allergies: Patient has no known allergies.  Body mass index is 27.56 kg/m.  Blood pressure 112/69, pulse 78, temperature 97.9 F (36.6 C), temperature source Oral, height 5' 7.75" (1.721 m), weight 179 lb 14.4 oz (81.6 kg), last menstrual period 10/29/2000, SpO2 98 %.     Review of Systems  Constitutional: Positive for fatigue. Negative for activity change, appetite change, chills, diaphoresis, fever and unexpected weight change.  HENT: Negative for congestion.   Eyes: Negative for visual disturbance.  Respiratory: Negative for cough, chest tightness, shortness of breath, wheezing and stridor.   Cardiovascular: Negative for chest pain, palpitations and leg swelling.  Gastrointestinal: Negative for abdominal distention, anal bleeding, blood in stool, constipation, diarrhea, nausea and vomiting.  Endocrine: Negative for cold intolerance, heat intolerance, polydipsia, polyphagia and polyuria.  Genitourinary: Negative for difficulty urinating and flank pain.  Musculoskeletal: Negative for arthralgias, back pain, gait problem, joint swelling, myalgias, neck pain and neck stiffness.  Skin: Negative for color change, pallor, rash and wound.  Neurological: Negative for dizziness and headaches.  Hematological: Negative for adenopathy. Does not bruise/bleed easily.  Psychiatric/Behavioral: Negative for agitation, behavioral problems, confusion, decreased concentration, dysphoric mood, hallucinations, self-injury, sleep disturbance and suicidal ideas. The patient is not nervous/anxious and is not hyperactive.        Objective:   Physical Exam Vitals signs and nursing note reviewed.  Constitutional:      General: She is not in acute distress.    Appearance:  Normal appearance. She is normal weight. She is not ill-appearing, toxic-appearing or diaphoretic.  HENT:     Head: Normocephalic and atraumatic.     Right Ear: Tympanic membrane, ear canal and external ear normal. There is no impacted cerumen.     Left Ear: Tympanic membrane, ear canal and external ear normal. There is no impacted cerumen.     Nose: Nose normal. No congestion.     Mouth/Throat:     Mouth: Mucous membranes are moist.     Pharynx: No oropharyngeal exudate.  Eyes:     Extraocular Movements: Extraocular movements intact.     Conjunctiva/sclera: Conjunctivae normal.     Pupils: Pupils are equal, round, and reactive to light.  Neck:     Musculoskeletal: Normal range of motion and neck supple.  Cardiovascular:     Rate and Rhythm: Normal rate and regular rhythm.     Pulses: Normal pulses.     Heart sounds: Normal heart sounds. No murmur. No friction rub. No gallop.   Pulmonary:  Effort: Pulmonary effort is normal. No respiratory distress.     Breath sounds: Normal breath sounds. No stridor. No wheezing, rhonchi or rales.  Chest:     Chest wall: No tenderness.  Abdominal:     General: Abdomen is flat. Bowel sounds are normal. There is no distension.     Palpations: Abdomen is soft. There is no mass.     Tenderness: There is no abdominal tenderness. There is no right CVA tenderness, left CVA tenderness, guarding or rebound.     Hernia: No hernia is present.  Musculoskeletal: Normal range of motion.  Skin:    General: Skin is warm and dry.     Capillary Refill: Capillary refill takes less than 2 seconds.  Neurological:     Mental Status: She is alert and oriented to person, place, and time.     Coordination: Coordination normal.  Psychiatric:        Mood and Affect: Mood normal.        Thought Content: Thought content normal.        Judgment: Judgment normal.        Assessment & Plan:   1. Screening for colon cancer   2. Elevated LDL cholesterol level   3.  Serum potassium elevated   4. Vitamin D deficiency   5. Healthcare maintenance     Healthcare maintenance Increase water intake, strive for at least 85 ounces/day.   Follow Heart Healthy diet Increase regular exercise.  Recommend at least 30 minutes daily, 5 days per week of walking, biking, swimming, YouTube/Pinterest workout videos. Please schedule fasting labs in 4 months, office visit a few days after that. Reduce K+ rich foods  Continue to social distance and wear a mask when in public.  Elevated LDL cholesterol level The 10-year ASCVD risk score [name] Cassandra Jr., et al., 2013) is: 3.7%  Values used to calculate the score:   Age: 73 years   Sex: Female   Is Non-Hispanic African American: No   Diabetic: No   Tobacco smoker: No   Systolic Blood Pressure: 123456 mmHg   Is BP treated: No   HDL Cholesterol: 46 mg/dL   Total Cholesterol: 191 mg/dL  LDL-115     FOLLOW-UP:  Return in about 4 months (around 09/24/2019) for Regular Follow Up, Fasting Labs, Hypercholestermia.

## 2019-05-25 ENCOUNTER — Ambulatory Visit (INDEPENDENT_AMBULATORY_CARE_PROVIDER_SITE_OTHER): Payer: Commercial Managed Care - PPO | Admitting: Adult Health

## 2019-05-25 ENCOUNTER — Encounter: Payer: Self-pay | Admitting: Adult Health

## 2019-05-25 ENCOUNTER — Other Ambulatory Visit: Payer: Self-pay

## 2019-05-25 VITALS — BP 112/69 | HR 78 | Temp 97.9°F | Ht 67.75 in | Wt 179.9 lb

## 2019-05-25 DIAGNOSIS — E7800 Pure hypercholesterolemia, unspecified: Secondary | ICD-10-CM

## 2019-05-25 DIAGNOSIS — E875 Hyperkalemia: Secondary | ICD-10-CM

## 2019-05-25 DIAGNOSIS — Z1211 Encounter for screening for malignant neoplasm of colon: Secondary | ICD-10-CM | POA: Diagnosis not present

## 2019-05-25 DIAGNOSIS — Z0000 Encounter for general adult medical examination without abnormal findings: Secondary | ICD-10-CM

## 2019-05-25 DIAGNOSIS — E559 Vitamin D deficiency, unspecified: Secondary | ICD-10-CM

## 2019-05-25 NOTE — Patient Instructions (Addendum)
Preventive Care for Adults, Female  A healthy lifestyle and preventive care can promote health and wellness. Preventive health guidelines for women include the following key practices.   A routine yearly physical is a good way to check with your health care provider about your health and preventive screening. It is a chance to share any concerns and updates on your health and to receive a thorough exam.   Visit your dentist for a routine exam and preventive care every 6 months. Brush your teeth twice a day and floss once a day. Good oral hygiene prevents tooth decay and gum disease.   The frequency of eye exams is based on your age, health, family medical history, use of contact lenses, and other factors. Follow your health care provider's recommendations for frequency of eye exams.   Eat a healthy diet. Foods like vegetables, fruits, whole grains, low-fat dairy products, and lean protein foods contain the nutrients you need without too many calories. Decrease your intake of foods high in solid fats, added sugars, and salt. Eat the right amount of calories for you.Get information about a proper diet from your health care provider, if necessary.   Regular physical exercise is one of the most important things you can do for your health. Most adults should get at least 150 minutes of moderate-intensity exercise (any activity that increases your heart rate and causes you to sweat) each week. In addition, most adults need muscle-strengthening exercises on 2 or more days a week.   Maintain a healthy weight. The body mass index (BMI) is a screening tool to identify possible weight problems. It provides an estimate of body fat based on height and weight. Your health care provider can find your BMI, and can help you achieve or maintain a healthy weight.For adults 20 years and older:   - A BMI below 18.5 is considered underweight.   - A BMI of 18.5 to 24.9 is normal.   - A BMI of 25 to 29.9 is  considered overweight.   - A BMI of 30 and above is considered obese.   Maintain normal blood lipids and cholesterol levels by exercising and minimizing your intake of trans and saturated fats.  Eat a balanced diet with plenty of fruit and vegetables. Blood tests for lipids and cholesterol should begin at age 20 and be repeated every 5 years minimum.  If your lipid or cholesterol levels are high, you are over 40, or you are at high risk for heart disease, you may need your cholesterol levels checked more frequently.Ongoing high lipid and cholesterol levels should be treated with medicines if diet and exercise are not working.   If you smoke, find out from your health care provider how to quit. If you do not use tobacco, do not start.   Lung cancer screening is recommended for adults aged 55-80 years who are at high risk for developing lung cancer because of a history of smoking. A yearly low-dose CT scan of the lungs is recommended for people who have at least a 30-pack-year history of smoking and are a current smoker or have quit within the past 15 years. A pack year of smoking is smoking an average of 1 pack of cigarettes a day for 1 year (for example: 1 pack a day for 30 years or 2 packs a day for 15 years). Yearly screening should continue until the smoker has stopped smoking for at least 15 years. Yearly screening should be stopped for people who develop a   health problem that would prevent them from having lung cancer treatment.   If you are pregnant, do not drink alcohol. If you are breastfeeding, be very cautious about drinking alcohol. If you are not pregnant and choose to drink alcohol, do not have more than 1 drink per day. One drink is considered to be 12 ounces (355 mL) of beer, 5 ounces (148 mL) of wine, or 1.5 ounces (44 mL) of liquor.   Avoid use of street drugs. Do not share needles with anyone. Ask for help if you need support or instructions about stopping the use of  drugs.   High blood pressure causes heart disease and increases the risk of stroke. Your blood pressure should be checked at least yearly.  Ongoing high blood pressure should be treated with medicines if weight loss and exercise do not work.   If you are 69-55 years old, ask your health care provider if you should take aspirin to prevent strokes.   Diabetes screening involves taking a blood sample to check your fasting blood sugar level. This should be done once every 3 years, after age 38, if you are within normal weight and without risk factors for diabetes. Testing should be considered at a younger age or be carried out more frequently if you are overweight and have at least 1 risk factor for diabetes.   Breast cancer screening is essential preventive care for women. You should practice "breast self-awareness."  This means understanding the normal appearance and feel of your breasts and may include breast self-examination.  Any changes detected, no matter how small, should be reported to a health care provider.  Women in their 80s and 30s should have a clinical breast exam (CBE) by a health care provider as part of a regular health exam every 1 to 3 years.  After age 66, women should have a CBE every year.  Starting at age 1, women should consider having a mammogram (breast X-ray test) every year.  Women who have a family history of breast cancer should talk to their health care provider about genetic screening.  Women at a high risk of breast cancer should talk to their health care providers about having an MRI and a mammogram every year.   -Breast cancer gene (BRCA)-related cancer risk assessment is recommended for women who have family members with BRCA-related cancers. BRCA-related cancers include breast, ovarian, tubal, and peritoneal cancers. Having family members with these cancers may be associated with an increased risk for harmful changes (mutations) in the breast cancer genes BRCA1 and  BRCA2. Results of the assessment will determine the need for genetic counseling and BRCA1 and BRCA2 testing.   The Pap test is a screening test for cervical cancer. A Pap test can show cell changes on the cervix that might become cervical cancer if left untreated. A Pap test is a procedure in which cells are obtained and examined from the lower end of the uterus (cervix).   - Women should have a Pap test starting at age 57.   - Between ages 90 and 70, Pap tests should be repeated every 2 years.   - Beginning at age 63, you should have a Pap test every 3 years as long as the past 3 Pap tests have been normal.   - Some women have medical problems that increase the chance of getting cervical cancer. Talk to your health care provider about these problems. It is especially important to talk to your health care provider if a  new problem develops soon after your last Pap test. In these cases, your health care provider may recommend more frequent screening and Pap tests.   - The above recommendations are the same for women who have or have not gotten the vaccine for human papillomavirus (HPV).   - If you had a hysterectomy for a problem that was not cancer or a condition that could lead to cancer, then you no longer need Pap tests. Even if you no longer need a Pap test, a regular exam is a good idea to make sure no other problems are starting.   - If you are between ages 36 and 66 years, and you have had normal Pap tests going back 10 years, you no longer need Pap tests. Even if you no longer need a Pap test, a regular exam is a good idea to make sure no other problems are starting.   - If you have had past treatment for cervical cancer or a condition that could lead to cancer, you need Pap tests and screening for cancer for at least 20 years after your treatment.   - If Pap tests have been discontinued, risk factors (such as a new sexual partner) need to be reassessed to determine if screening should  be resumed.   - The HPV test is an additional test that may be used for cervical cancer screening. The HPV test looks for the virus that can cause the cell changes on the cervix. The cells collected during the Pap test can be tested for HPV. The HPV test could be used to screen women aged 70 years and older, and should be used in women of any age who have unclear Pap test results. After the age of 67, women should have HPV testing at the same frequency as a Pap test.   Colorectal cancer can be detected and often prevented. Most routine colorectal cancer screening begins at the age of 57 years and continues through age 26 years. However, your health care provider may recommend screening at an earlier age if you have risk factors for colon cancer. On a yearly basis, your health care provider may provide home test kits to check for hidden blood in the stool.  Use of a small camera at the end of a tube, to directly examine the colon (sigmoidoscopy or colonoscopy), can detect the earliest forms of colorectal cancer. Talk to your health care provider about this at age 23, when routine screening begins. Direct exam of the colon should be repeated every 5 -10 years through age 49 years, unless early forms of pre-cancerous polyps or small growths are found.   People who are at an increased risk for hepatitis B should be screened for this virus. You are considered at high risk for hepatitis B if:  -You were born in a country where hepatitis B occurs often. Talk with your health care provider about which countries are considered high risk.  - Your parents were born in a high-risk country and you have not received a shot to protect against hepatitis B (hepatitis B vaccine).  - You have HIV or AIDS.  - You use needles to inject street drugs.  - You live with, or have sex with, someone who has Hepatitis B.  - You get hemodialysis treatment.  - You take certain medicines for conditions like cancer, organ  transplantation, and autoimmune conditions.   Hepatitis C blood testing is recommended for all people born from [birth year] through 1965 and any individual  with known risks for hepatitis C.   Practice safe sex. Use condoms and avoid high-risk sexual practices to reduce the spread of sexually transmitted infections (STIs). STIs include gonorrhea, chlamydia, syphilis, trichomonas, herpes, HPV, and human immunodeficiency virus (HIV). Herpes, HIV, and HPV are viral illnesses that have no cure. They can result in disability, cancer, and death. Sexually active women aged 63 years and younger should be checked for chlamydia. Older women with new or multiple partners should also be tested for chlamydia. Testing for other STIs is recommended if you are sexually active and at increased risk.   Osteoporosis is a disease in which the bones lose minerals and strength with aging. This can result in serious bone fractures or breaks. The risk of osteoporosis can be identified using a bone density scan. Women ages 86 years and over and women at risk for fractures or osteoporosis should discuss screening with their health care providers. Ask your health care provider whether you should take a calcium supplement or vitamin D to There are also several preventive steps women can take to avoid osteoporosis and resulting fractures or to keep osteoporosis from worsening. -->Recommendations include:  Eat a balanced diet high in fruits, vegetables, calcium, and vitamins.  Get enough calcium. The recommended total intake of is 1,200 mg daily; for best absorption, if taking supplements, divide doses into 250-500 mg doses throughout the day. Of the two types of calcium, calcium carbonate is best absorbed when taken with food but calcium citrate can be taken on an empty stomach.  Get enough vitamin D. [employer] and the [employer] recommend at least 1,000 IU per day for women age 51 and over who are at risk of vitamin D  deficiency. Vitamin D deficiency can be caused by inadequate sun exposure (for example, those who live in [place]).  Avoid alcohol and smoking. Heavy alcohol intake (more than 7 drinks per week) increases the risk of falls and hip fracture and women smokers tend to lose bone more rapidly and have lower bone mass than nonsmokers. Stopping smoking is one of the most important changes women can make to improve their health and decrease risk for disease.  Be physically active every day. Weight-bearing exercise (for example, fast walking, hiking, jogging, and weight training) may strengthen bones or slow the rate of bone loss that comes with aging. Balancing and muscle-strengthening exercises can reduce the risk of falling and fracture.  Consider therapeutic medications. Currently, several types of effective drugs are available. Healthcare providers can recommend the type most appropriate for each woman.  Eliminate environmental factors that may contribute to accidents. Falls cause nearly 90% of all osteoporotic fractures, so reducing this risk is an important bone-health strategy. Measures include ample lighting, removing obstructions to walking, using nonskid rugs on floors, and placing mats and/or grab bars in showers.  Be aware of medication side effects. Some common medicines make bones weaker. These include a type of steroid drug called glucocorticoids used for arthritis and asthma, some antiseizure drugs, certain sleeping pills, treatments for endometriosis, and some cancer drugs. An overactive thyroid gland or using too much thyroid hormone for an underactive thyroid can also be a problem. If you are taking these medicines, talk to your doctor about what you can do to help protect your bones.reduce the rate of osteoporosis.    Menopause can be associated with physical symptoms and risks. Hormone replacement therapy is available to decrease symptoms and risks. You should talk to your  health care provider  about whether hormone replacement therapy is right for you.   Use sunscreen. Apply sunscreen liberally and repeatedly throughout the day. You should seek shade when your shadow is shorter than you. Protect yourself by wearing long sleeves, pants, a wide-brimmed hat, and sunglasses year round, whenever you are outdoors.   Once a month, do a whole body skin exam, using a mirror to look at the skin on your back. Tell your health care provider of new moles, moles that have irregular borders, moles that are larger than a pencil eraser, or moles that have changed in shape or color.   -Stay current with required vaccines (immunizations).   Influenza vaccine. All adults should be immunized every year.  Tetanus, diphtheria, and acellular pertussis (Td, Tdap) vaccine. Pregnant women should receive 1 dose of Tdap vaccine during each pregnancy. The dose should be obtained regardless of the length of time since the last dose. Immunization is preferred during the 27th 36th week of gestation. An adult who has not previously received Tdap or who does not know her vaccine status should receive 1 dose of Tdap. This initial dose should be followed by tetanus and diphtheria toxoids (Td) booster doses every 10 years. Adults with an unknown or incomplete history of completing a 3-dose immunization series with Td-containing vaccines should begin or complete a primary immunization series including a Tdap dose. Adults should receive a Td booster every 10 years.  Varicella vaccine. An adult without evidence of immunity to varicella should receive 2 doses or a second dose if she has previously received 1 dose. Pregnant females who do not have evidence of immunity should receive the first dose after pregnancy. This first dose should be obtained before leaving the health care facility. The second dose should be obtained 4 8 weeks after the first dose.  Human papillomavirus (HPV) vaccine. Females aged 13 26  years who have not received the vaccine previously should obtain the 3-dose series. The vaccine is not recommended for use in pregnant females. However, pregnancy testing is not needed before receiving a dose. If a female is found to be pregnant after receiving a dose, no treatment is needed. In that case, the remaining doses should be delayed until after the pregnancy. Immunization is recommended for any person with an immunocompromised condition through the age of 26 years if she did not get any or all doses earlier. During the 3-dose series, the second dose should be obtained 4 8 weeks after the first dose. The third dose should be obtained 24 weeks after the first dose and 16 weeks after the second dose.  Zoster vaccine. One dose is recommended for adults aged 60 years or older unless certain conditions are present.  Measles, mumps, and rubella (MMR) vaccine. Adults born before 1957 generally are considered immune to measles and mumps. Adults born in 1957 or later should have 1 or more doses of MMR vaccine unless there is a contraindication to the vaccine or there is laboratory evidence of immunity to each of the three diseases. A routine second dose of MMR vaccine should be obtained at least 28 days after the first dose for students attending postsecondary schools, health care workers, or international travelers. People who received inactivated measles vaccine or an unknown type of measles vaccine during 1963 1967 should receive 2 doses of MMR vaccine. People who received inactivated mumps vaccine or an unknown type of mumps vaccine before 1979 and are at high risk for mumps infection should consider immunization with 2 doses of   MMR vaccine. For females of childbearing age, rubella immunity should be determined. If there is no evidence of immunity, females who are not pregnant should be vaccinated. If there is no evidence of immunity, females who are pregnant should delay immunization until after pregnancy.  Unvaccinated health care workers born before [birth year] who lack laboratory evidence of measles, mumps, or rubella immunity or laboratory confirmation of disease should consider measles and mumps immunization with 2 doses of MMR vaccine or rubella immunization with 1 dose of MMR vaccine.  Pneumococcal 13-valent conjugate (PCV13) vaccine. When indicated, a person who is uncertain of her immunization history and has no record of immunization should receive the PCV13 vaccine. An adult aged 15 years or older who has certain medical conditions and has not been previously immunized should receive 1 dose of PCV13 vaccine. This PCV13 should be followed with a dose of pneumococcal polysaccharide (PPSV23) vaccine. The PPSV23 vaccine dose should be obtained at least 8 weeks after the dose of PCV13 vaccine. An adult aged 41 years or older who has certain medical conditions and previously received 1 or more doses of PPSV23 vaccine should receive 1 dose of PCV13. The PCV13 vaccine dose should be obtained 1 or more years after the last PPSV23 vaccine dose.  Pneumococcal polysaccharide (PPSV23) vaccine. When PCV13 is also indicated, PCV13 should be obtained first. All adults aged 82 years and older should be immunized. An adult younger than age 70 years who has certain medical conditions should be immunized. Any person who resides in a nursing home or long-term care facility should be immunized. An adult smoker should be immunized. People with an immunocompromised condition and certain other conditions should receive both PCV13 and PPSV23 vaccines. People with human immunodeficiency virus (HIV) infection should be immunized as soon as possible after diagnosis. Immunization during chemotherapy or radiation therapy should be avoided. Routine use of PPSV23 vaccine is not recommended for American Indians, [place] Natives, or people younger than 65 years unless there are medical conditions that require PPSV23 vaccine. When indicated,  people who have unknown immunization and have no record of immunization should receive PPSV23 vaccine. One-time revaccination 5 years after the first dose of PPSV23 is recommended for people aged 82 64 years who have chronic kidney failure, nephrotic syndrome, asplenia, or immunocompromised conditions. People who received 1 2 doses of PPSV23 before age 49 years should receive another dose of PPSV23 vaccine at age 36 years or later if at least 5 years have passed since the previous dose. Doses of PPSV23 are not needed for people immunized with PPSV23 at or after age 9 years.  Meningococcal vaccine. Adults with asplenia or persistent complement component deficiencies should receive 2 doses of quadrivalent meningococcal conjugate (MenACWY-D) vaccine. The doses should be obtained at least 2 months apart. Microbiologists working with certain meningococcal bacteria, [employer], people at risk during an outbreak, and people who travel to or live in countries with a high rate of meningitis should be immunized. A first-year college student up through age 48 years who is living in a residence hall should receive a dose if she did not receive a dose on or after her 16th birthday. Adults who have certain high-risk conditions should receive one or more doses of vaccine.  Hepatitis A vaccine. Adults who wish to be protected from this disease, have certain high-risk conditions, work with hepatitis A-infected animals, work in hepatitis A research labs, or travel to or work in countries with a high rate of hepatitis A should be  immunized. Adults who were previously unvaccinated and who anticipate close contact with an international adoptee during the first 60 days after arrival in the [place] from a country with a high rate of hepatitis A should be immunized.  Hepatitis B vaccine.  Adults who wish to be protected from this disease, have certain high-risk conditions, may be exposed to blood or other infectious  body fluids, are household contacts or sex partners of hepatitis B positive people, are clients or workers in certain care facilities, or travel to or work in countries with a high rate of hepatitis B should be immunized.  Haemophilus influenzae type b (Hib) vaccine. A previously unvaccinated person with asplenia or sickle cell disease or having a scheduled splenectomy should receive 1 dose of Hib vaccine. Regardless of previous immunization, a recipient of a hematopoietic stem cell transplant should receive a 3-dose series 6 12 months after her successful transplant. Hib vaccine is not recommended for adults with HIV infection.  Preventive Services / Frequency Ages 91 to 39years  Blood pressure check.** / Every 1 to 2 years.  Lipid and cholesterol check.** / Every 5 years beginning at age 50.  Clinical breast exam.** / Every 3 years for women in their 71s and 33s.  BRCA-related cancer risk assessment.** / For women who have family members with a BRCA-related cancer (breast, ovarian, tubal, or peritoneal cancers).  Pap test.** / Every 2 years from ages 7 through 23. Every 3 years starting at age 4 through age 46 or 67 with a history of 3 consecutive normal Pap tests.  HPV screening.** / Every 3 years from ages 18 through ages 60 to 41 with a history of 3 consecutive normal Pap tests.  Hepatitis C blood test.** / For any individual with known risks for hepatitis C.  Skin self-exam. / Monthly.  Influenza vaccine. / Every year.  Tetanus, diphtheria, and acellular pertussis (Tdap, Td) vaccine.** / Consult your health care provider. Pregnant women should receive 1 dose of Tdap vaccine during each pregnancy. 1 dose of Td every 10 years.  Varicella vaccine.** / Consult your health care provider. Pregnant females who do not have evidence of immunity should receive the first dose after pregnancy.  HPV vaccine. / 3 doses over 6 months, if 30 and younger. The vaccine is not recommended for use in  pregnant females. However, pregnancy testing is not needed before receiving a dose.  Measles, mumps, rubella (MMR) vaccine.** / You need at least 1 dose of MMR if you were born in 1957 or later. You may also need a 2nd dose. For females of childbearing age, rubella immunity should be determined. If there is no evidence of immunity, females who are not pregnant should be vaccinated. If there is no evidence of immunity, females who are pregnant should delay immunization until after pregnancy.  Pneumococcal 13-valent conjugate (PCV13) vaccine.** / Consult your health care provider.  Pneumococcal polysaccharide (PPSV23) vaccine.** / 1 to 2 doses if you smoke cigarettes or if you have certain conditions.  Meningococcal vaccine.** / 1 dose if you are age 13 to 70 years and a [employer] researcher living in a residence hall, or have one of several medical conditions, you need to get vaccinated against meningococcal disease. You may also need additional booster doses.  Hepatitis A vaccine.** / Consult your health care provider.  Hepatitis B vaccine.** / Consult your health care provider.  Haemophilus influenzae type b (Hib) vaccine.** / Consult your health care provider.  Ages 64 to 63years  Blood pressure check.** / Every 1 to 2 years.  Lipid and cholesterol check.** / Every 5 years beginning at age 49 years.  Lung cancer screening. / Every year if you are aged 90 80 years and have a 30-pack-year history of smoking and currently smoke or have quit within the past 15 years. Yearly screening is stopped once you have quit smoking for at least 15 years or develop a health problem that would prevent you from having lung cancer treatment.  Clinical breast exam.** / Every year after age 46 years.  BRCA-related cancer risk assessment.** / For women who have family members with a BRCA-related cancer (breast, ovarian, tubal, or peritoneal cancers).  Mammogram.** / Every year beginning at age 36  years and continuing for as long as you are in good health. Consult with your health care provider.  Pap test.** / Every 3 years starting at age 32 years through age 92 or 23 years with a history of 3 consecutive normal Pap tests.  HPV screening.** / Every 3 years from ages 15 years through ages 41 to 34 years with a history of 3 consecutive normal Pap tests.  Fecal occult blood test (FOBT) of stool. / Every year beginning at age 72 years and continuing until age 74 years. You may not need to do this test if you get a colonoscopy every 10 years.  Flexible sigmoidoscopy or colonoscopy.** / Every 5 years for a flexible sigmoidoscopy or every 10 years for a colonoscopy beginning at age 60 years and continuing until age 33 years.  Hepatitis C blood test.** / For all people born from [birth year] through 1965 and any individual with known risks for hepatitis C.  Skin self-exam. / Monthly.  Influenza vaccine. / Every year.  Tetanus, diphtheria, and acellular pertussis (Tdap/Td) vaccine.** / Consult your health care provider. Pregnant women should receive 1 dose of Tdap vaccine during each pregnancy. 1 dose of Td every 10 years.  Varicella vaccine.** / Consult your health care provider. Pregnant females who do not have evidence of immunity should receive the first dose after pregnancy.  Zoster vaccine.** / 1 dose for adults aged 33 years or older.  Measles, mumps, rubella (MMR) vaccine.** / You need at least 1 dose of MMR if you were born in 1957 or later. You may also need a 2nd dose. For females of childbearing age, rubella immunity should be determined. If there is no evidence of immunity, females who are not pregnant should be vaccinated. If there is no evidence of immunity, females who are pregnant should delay immunization until after pregnancy.  Pneumococcal 13-valent conjugate (PCV13) vaccine.** / Consult your health care provider.  Pneumococcal polysaccharide (PPSV23) vaccine.** / 1 to 2 doses if  you smoke cigarettes or if you have certain conditions.  Meningococcal vaccine.** / Consult your health care provider.  Hepatitis A vaccine.** / Consult your health care provider.  Hepatitis B vaccine.** / Consult your health care provider.  Haemophilus influenzae type b (Hib) vaccine.** / Consult your health care provider.  Ages 34 years and over  Blood pressure check.** / Every 1 to 2 years.  Lipid and cholesterol check.** / Every 5 years beginning at age 28 years.  Lung cancer screening. / Every year if you are aged 66 80 years and have a 30-pack-year history of smoking and currently smoke or have quit within the past 15 years. Yearly screening is stopped once you have quit smoking for at least 15 years or develop a health problem that  would prevent you from having lung cancer treatment.  Clinical breast exam.** / Every year after age 103 years.  BRCA-related cancer risk assessment.** / For women who have family members with a BRCA-related cancer (breast, ovarian, tubal, or peritoneal cancers).  Mammogram.** / Every year beginning at age 36 years and continuing for as long as you are in good health. Consult with your health care provider.  Pap test.** / Every 3 years starting at age 5 years through age 85 or 10 years with 3 consecutive normal Pap tests. Testing can be stopped between 65 and 70 years with 3 consecutive normal Pap tests and no abnormal Pap or HPV tests in the past 10 years.  HPV screening.** / Every 3 years from ages 93 years through ages 70 or 45 years with a history of 3 consecutive normal Pap tests. Testing can be stopped between 65 and 70 years with 3 consecutive normal Pap tests and no abnormal Pap or HPV tests in the past 10 years.  Fecal occult blood test (FOBT) of stool. / Every year beginning at age 8 years and continuing until age 45 years. You may not need to do this test if you get a colonoscopy every 10 years.  Flexible sigmoidoscopy or colonoscopy.** /  Every 5 years for a flexible sigmoidoscopy or every 10 years for a colonoscopy beginning at age 69 years and continuing until age 68 years.  Hepatitis C blood test.** / For all people born from [birth year] through 1964 and any individual with known risks for hepatitis C.  Osteoporosis screening.** / A one-time screening for women ages 7 years and over and women at risk for fractures or osteoporosis.  Skin self-exam. / Monthly.  Influenza vaccine. / Every year.  Tetanus, diphtheria, and acellular pertussis (Tdap/Td) vaccine.** / 1 dose of Td every 10 years.  Varicella vaccine.** / Consult your health care provider.  Zoster vaccine.** / 1 dose for adults aged 5 years or older.  Pneumococcal 13-valent conjugate (PCV13) vaccine.** / Consult your health care provider.  Pneumococcal polysaccharide (PPSV23) vaccine.** / 1 dose for all adults aged 74 years and older.  Meningococcal vaccine.** / Consult your health care provider.  Hepatitis A vaccine.** / Consult your health care provider.  Hepatitis B vaccine.** / Consult your health care provider.  Haemophilus influenzae type b (Hib) vaccine.** / Consult your health care provider. ** Family history and personal history of risk and conditions may change your health care provider's recommendations. Document Released: 11/06/2001 Document Revised: 07/01/2013  [facility] Patient Information 2014 [place], [place].   EXERCISE AND DIET:  We recommended that you start or continue a regular exercise program for good health. Regular exercise means any activity that makes your heart beat faster and makes you sweat.  We recommend exercising at least 30 minutes per day at least 3 days a week, preferably 5.  We also recommend a diet low in fat and sugar / carbohydrates.  Inactivity, poor dietary choices and obesity can cause diabetes, heart attack, stroke, and kidney damage, among others.     ALCOHOL AND SMOKING:  Women should limit their alcohol intake to no  more than 7 drinks/beers/glasses of wine (combined, not each!) per week. Moderation of alcohol intake to this level decreases your risk of breast cancer and liver damage.  ( And of course, no recreational drugs are part of a healthy lifestyle.)  Also, you should not be smoking at all or even being exposed to second hand smoke. Most people know smoking can  cause cancer, and various heart and lung diseases, but did you know it also contributes to weakening of your bones?  Aging of your skin?  Yellowing of your teeth and nails?   CALCIUM AND VITAMIN D:  Adequate intake of calcium and Vitamin D are recommended.  The recommendations for exact amounts of these supplements seem to change often, but generally speaking 600 mg of calcium (either carbonate or citrate) and 800 units of Vitamin D per day seems prudent. Certain women may benefit from higher intake of Vitamin D.  If you are among these women, your doctor will have told you during your visit.     PAP SMEARS:  Pap smears, to check for cervical cancer or precancers,  have traditionally been done yearly, although recent scientific advances have shown that most women can have pap smears less often.  However, every woman still should have a physical exam from her gynecologist or primary care physician every year. It will include a breast check, inspection of the vulva and vagina to check for abnormal growths or skin changes, a visual exam of the cervix, and then an exam to evaluate the size and shape of the uterus and ovaries.  And after 62 years of age, a rectal exam is indicated to check for rectal cancers. We will also provide age appropriate advice regarding health maintenance, like when you should have certain vaccines, screening for sexually transmitted diseases, bone density testing, colonoscopy, mammograms, etc.    MAMMOGRAMS:  All women over 53 years old should have a yearly mammogram. Many facilities now offer a "3D" mammogram, which may cost  around $50 extra out of pocket. If possible,  we recommend you accept the option to have the 3D mammogram performed.  It both reduces the number of women who will be called back for extra views which then turn out to be normal, and it is better than the routine mammogram at detecting truly abnormal areas.     COLONOSCOPY:  Colonoscopy to screen for colon cancer is recommended for all women at age 71.  We know, you hate the idea of the prep.  We agree, BUT, having colon cancer and not knowing it is worse!!  Colon cancer so often starts as a polyp that can be seen and removed at colonscopy, which can quite literally save your life!  And if your first colonoscopy is normal and you have no family history of colon cancer, most women don't have to have it again for 10 years.  Once every ten years, you can do something that may end up saving your life, right?  We will be happy to help you get it scheduled when you are ready.  Be sure to check your insurance coverage so you understand how much it will cost.  It may be covered as a preventative service at no cost, but you should check your particular policy.    Increase water intake, strive for at least 85 ounces/day.   Follow Heart Healthy diet Increase regular exercise.  Recommend at least 30 minutes daily, 5 days per week of walking, biking, swimming, YouTube/Pinterest workout videos. Please schedule fasting labs in 4 months, office visit a few days after that. Continue to social distance and wear a mask when in public. Reduce K+ rich foods  GREAT TO SEE YOU!

## 2019-05-25 NOTE — Assessment & Plan Note (Signed)
The 10-year ASCVD risk score [name], et al., 2013) is: 3.7%  Values used to calculate the score:   Age: 63 years   Sex: Female   Is Non-Hispanic African American: No   Diabetic: No   Tobacco smoker: No   Systolic Blood Pressure: 123456 mmHg   Is BP treated: No   HDL Cholesterol: 46 mg/dL   Total Cholesterol: 191 mg/dL  LDL-115

## 2019-05-25 NOTE — Assessment & Plan Note (Addendum)
Increase water intake, strive for at least 85 ounces/day.   Follow Heart Healthy diet Increase regular exercise.  Recommend at least 30 minutes daily, 5 days per week of walking, biking, swimming, YouTube/Pinterest workout videos. Please schedule fasting labs in 4 months, office visit a few days after that. Reduce K+ rich foods  Continue to social distance and wear a mask when in public.

## 2019-06-08 ENCOUNTER — Encounter: Payer: Self-pay | Admitting: Adult Health

## 2019-06-25 ENCOUNTER — Encounter: Payer: Self-pay | Admitting: Adult Health

## 2019-07-15 ENCOUNTER — Telehealth: Payer: Self-pay | Admitting: Adult Health

## 2019-07-15 NOTE — Telephone Encounter (Signed)
No HIV screening information found @ [facility] per med recs [phone]--glh

## 2019-07-20 ENCOUNTER — Other Ambulatory Visit: Payer: Self-pay | Admitting: Adult Health

## 2019-07-20 ENCOUNTER — Telehealth: Payer: Self-pay | Admitting: Adult Health

## 2019-07-20 MED ORDER — SYNTHROID 100 MCG PO TABS: ORAL_TABLET | ORAL | 0 refills | Status: DC

## 2019-07-20 NOTE — Telephone Encounter (Signed)
Pt informed.  Pt states that she does not need thyroid medication at this time.  Cancelled RX at [facility].  Charyl Bigger, CMA

## 2019-07-20 NOTE — Telephone Encounter (Signed)
Pr patient pharmacy states she needs to contact Provider regarding Rx refill request..  ---Per patient  thought Vit D Rx was for 4 month @ (pls review Rx )  Also patient says it's time for her thyroid Rx refill and wanted to make sure it was approved by provider .    ---Forwarding request to medical assistant.  --[name]

## 2019-08-02 ENCOUNTER — Other Ambulatory Visit: Payer: Self-pay | Admitting: Adult Health

## 2019-08-10 ENCOUNTER — Telehealth: Payer: Self-pay | Admitting: Adult Health

## 2019-08-10 MED ORDER — SYNTHROID 100 MCG PO TABS: ORAL_TABLET | ORAL | 0 refills | Status: DC

## 2019-08-10 NOTE — Telephone Encounter (Signed)
Patient called upset because her Rx for: (was sent for 30dys instead of the (90dys requested))  SYNTHROID 100 MCG tablet [id number]   Order Details Dose, Route, Frequency: As Directed  Dispense Quantity: 30 tablet Refills: 0 Fills remaining: --        Sig: TAKE 1 TABLET DAILY, **MAKE AN APPOINTMENT FOR FURTHER REFILLS**          --Forwarding message to medical asst to see if corrections can be made to pharmacy for (3 month supply) To:  [facility] [place], [place] to [address] [phone] (Phone) [phone] (Fax  ----glh

## 2019-09-09 ENCOUNTER — Other Ambulatory Visit: Payer: Self-pay

## 2019-09-11 ENCOUNTER — Encounter: Payer: Self-pay | Admitting: Certified Nurse Midwife

## 2019-09-11 ENCOUNTER — Ambulatory Visit (INDEPENDENT_AMBULATORY_CARE_PROVIDER_SITE_OTHER): Payer: Commercial Managed Care - PPO | Admitting: Certified Nurse Midwife

## 2019-09-11 ENCOUNTER — Other Ambulatory Visit: Payer: Self-pay

## 2019-09-11 ENCOUNTER — Other Ambulatory Visit: Payer: Self-pay | Admitting: Certified Nurse Midwife

## 2019-09-11 VITALS — BP 110/70 | HR 70 | Temp 97.9°F | Resp 16 | Ht 67.5 in | Wt 180.0 lb

## 2019-09-11 DIAGNOSIS — Z01419 Encounter for gynecological examination (general) (routine) without abnormal findings: Secondary | ICD-10-CM | POA: Diagnosis not present

## 2019-09-11 DIAGNOSIS — Z780 Asymptomatic menopausal state: Secondary | ICD-10-CM

## 2019-09-11 NOTE — Patient Instructions (Signed)

## 2019-09-11 NOTE — Progress Notes (Signed)
63 y.o. G0P0000 Married Caucasian Fe here for annual exam. Menopausal no HRT. Denies vaginal bleeding or dryness. Has not started smoking again. Visited [place] in February and had flu like illness on return. Saw PCP 6 months ago for thyroid management and Vitamin D. All stable per patient. 5 pound weight gain since last aex, exercises with puppy. Has been trying to keep up with dental health with several root canals needed.  No health issues today.  Patient's last menstrual period was 10/29/2000.          Sexually active: No.  The current method of family planning is post menopausal status.    Exercising: Yes.    walking Smoker:  no  Review of Systems  Constitutional: Negative.   HENT: Negative.   Eyes: Negative.   Respiratory: Negative.   Cardiovascular: Negative.   Gastrointestinal: Negative.   Genitourinary: Negative.   Musculoskeletal: Negative.   Skin: Negative.   Neurological: Negative.   Endo/Heme/Allergies: Negative.   Psychiatric/Behavioral: Negative.     Health Maintenance: Pap:  01-07-17 neg, 09-09-18 neg HPV HR neg History of Abnormal Pap: yes MMG:  02-25-2019 category c density birads 1:neg Self Breast exams: yes Colonoscopy:  Not done BMD: not yet TDaP:  2018 Shingles: not done Pneumonia: not done Hep C and HIV: hep c neg 2018, previous + Labs: no had with PCP   reports that she quit smoking about 3 years ago. Her smoking use included cigarettes. She has a 19.00 pack-year smoking history. She has never used smokeless tobacco. She reports current alcohol use. She reports that she does not use drugs.  Past Medical History:  Diagnosis Date  . Abnormal Pap smear of cervix   . Arthritis   . Blood transfusion    1968  . Blood transfusion without reported diagnosis   . Cancer ([place])    cervical  . Complication of anesthesia    sensitive to all meds- takes longer to metabolize  . 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 Hx of BLACK WIDOW  SPIDER BITE x 2 left shoulder 10/11 with  "knot at base of scull"   seen by PCP- no neuro visit- resolved  . 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 Hypoglycemia   . Hypothyroidism     Past Surgical History:  Procedure Laterality Date  . CERVICAL CONE BIOPSY    . COLONOSCOPY    . EYE SURGERY     Lasic- left eye  . laser conization    . TONSILLECTOMY    . TOTAL HIP ARTHROPLASTY  10/30/2011   Procedure: TOTAL HIP ARTHROPLASTY;  Surgeon: Magnus Sinning, MD;  Location: [facility];  Service: Orthopedics;  Laterality: Left;    Current Outpatient Medications  Medication Sig Dispense Refill  . Calcium 500 MG CHEW Chew 1 each by mouth at bedtime.    . Multiple Vitamin (MULTIVITAMIN PO) Take by mouth.    . Probiotic Product (PROBIOTIC FORMULA PO) Take 1 capsule by mouth daily.    Marland Kitchen SYNTHROID 100 MCG tablet TAKE 1 TABLET DAILY 90 tablet 0  . Vitamin D, Ergocalciferol, (DRISDOL) 1.25 MG (50000 UT) CAPS capsule TAKE 1 CAPSULE BY MOUTH EVERY 7 DAYS 16 capsule 0   No current facility-administered medications for this visit.    Family History  Adopted: Yes    ROS:  Pertinent items are noted in HPI.  Otherwise, a comprehensive ROS was negative.  Exam:   BP 110/70   Pulse  70   Temp 97.9 F (36.6 C) (Skin)   Resp 16   Ht 5' 7.5" (1.715 m)   Wt 180 lb (81.6 kg)   LMP 10/29/2000   BMI 27.78 kg/m  Height: 5' 7.5" (171.5 cm) Ht Readings from Last 3 Encounters:  09/11/19 5' 7.5" (1.715 m)  05/25/19 5' 7.75" (1.721 m)  09/09/18 5' 7.5" (1.715 m)    General appearance: alert, cooperative and appears stated age Head: Normocephalic, without obvious abnormality, atraumatic Neck: no adenopathy, supple, symmetrical, trachea midline and thyroid normal to inspection and palpation Lungs: clear to auscultation bilaterally Breasts: normal appearance, no masses or tenderness, No nipple retraction or dimpling, No nipple discharge or bleeding, No axillary or supraclavicular adenopathy Heart: regular rate  and rhythm Abdomen: soft, non-tender; no masses,  no organomegaly Extremities: extremities normal, atraumatic, no cyanosis or edema Skin: Skin color, texture, turgor normal. No rashes or lesions Lymph nodes: Cervical, supraclavicular, and axillary nodes normal. No abnormal inguinal nodes palpated Neurologic: Grossly normal   Pelvic: External genitalia:  no lesions, normal female              Urethra:  normal appearing urethra with no masses, tenderness or lesions              Bartholin's and Skene's: normal                 Vagina: dry appearing vagina with normal color and scant discharge, no lesions              Cervix: no cervical motion tenderness, no lesions and normal appearance              Pap taken: No. Bimanual Exam:  Uterus:  normal size, contour, position, consistency, mobility, non-tender and anteverted              Adnexa: normal adnexa and no mass, fullness, tenderness               Rectovaginal: Confirms               Anus:  normal sphincter tone, no lesions  Chaperone present: yes  A:  Well Woman with normal exam  Post menopausal no HRT  Hypothyroid,Vitamin D deficiency with PCP management  Former smoker, has not resumed  Colonoscopy due  P:   Reviewed health and wellness pertinent to exam  Aware of need to advise if vaginal bleeding.  Discussed use of OTC coconut oil or Olive  Oil for dryness, prn  Continue follow up with PCP as indicated.  Discussed importance of coloscopy, patient has referral to Dr. Collene Mares and plans to schedule soon. Declines IFOB  Pap smear: no   counseled on breast self exam, mammography screening, feminine hygiene, menopause, adequate intake of calcium and vitamin D, diet and exercise return annually or prn  An After Visit Summary was printed and given to the patient.

## 2019-09-21 ENCOUNTER — Other Ambulatory Visit: Payer: Self-pay

## 2019-09-21 ENCOUNTER — Other Ambulatory Visit: Payer: Commercial Managed Care - PPO

## 2019-09-21 DIAGNOSIS — E875 Hyperkalemia: Secondary | ICD-10-CM

## 2019-09-21 DIAGNOSIS — E559 Vitamin D deficiency, unspecified: Secondary | ICD-10-CM

## 2019-09-21 DIAGNOSIS — E7800 Pure hypercholesterolemia, unspecified: Secondary | ICD-10-CM

## 2019-09-22 ENCOUNTER — Ambulatory Visit: Payer: Commercial Managed Care - PPO | Admitting: Obstetrics and Gynecology

## 2019-09-22 LAB — VITAMIN D 25 HYDROXY (VIT D DEFICIENCY, FRACTURES): Vit D, 25-Hydroxy: 31.6 ng/mL (ref 30.0–100.0)

## 2019-09-22 LAB — COMPREHENSIVE METABOLIC PANEL
ALT: 21 IU/L (ref 0–32)
AST: 18 IU/L (ref 0–40)
Albumin/Globulin Ratio: 1.8 (ref 1.2–2.2)
Albumin: 4.6 g/dL (ref 3.8–4.8)
Alkaline Phosphatase: 60 IU/L (ref 39–117)
BUN/Creatinine Ratio: 21 (ref 12–28)
BUN: 16 mg/dL (ref 8–27)
Bilirubin Total: 0.6 mg/dL (ref 0.0–1.2)
CO2: 24 mmol/L (ref 20–29)
Calcium: 10.1 mg/dL (ref 8.7–10.3)
Chloride: 104 mmol/L (ref 96–106)
Creatinine, Ser: 0.77 mg/dL (ref 0.57–1.00)
GFR calc Af Amer: 96 mL/min/{1.73_m2} (ref >59)
GFR calc non Af Amer: 83 mL/min/{1.73_m2} (ref >59)
Globulin, Total: 2.5 g/dL (ref 1.5–4.5)
Glucose: 98 mg/dL (ref 65–99)
Potassium: 4.8 mmol/L (ref 3.5–5.2)
Sodium: 142 mmol/L (ref 134–144)
Total Protein: 7.1 g/dL (ref 6.0–8.5)

## 2019-09-22 LAB — LIPID PANEL
Chol/HDL Ratio: 4.2 ratio (ref 0.0–4.4)
Cholesterol, Total: 202 mg/dL — ABNORMAL HIGH (ref 100–199)
HDL: 48 mg/dL (ref >39)
LDL Chol Calc (NIH): 129 mg/dL — ABNORMAL HIGH (ref 0–99)
Triglycerides: 139 mg/dL (ref 0–149)
VLDL Cholesterol Cal: 25 mg/dL (ref 5–40)

## 2019-09-24 ENCOUNTER — Encounter: Payer: Self-pay | Admitting: Adult Health

## 2019-09-24 ENCOUNTER — Other Ambulatory Visit: Payer: Self-pay

## 2019-09-24 ENCOUNTER — Ambulatory Visit (INDEPENDENT_AMBULATORY_CARE_PROVIDER_SITE_OTHER): Payer: Commercial Managed Care - PPO | Admitting: Adult Health

## 2019-09-24 DIAGNOSIS — E039 Hypothyroidism, unspecified: Secondary | ICD-10-CM

## 2019-09-24 DIAGNOSIS — Z0000 Encounter for general adult medical examination without abnormal findings: Secondary | ICD-10-CM | POA: Diagnosis not present

## 2019-09-24 DIAGNOSIS — E7800 Pure hypercholesterolemia, unspecified: Secondary | ICD-10-CM

## 2019-09-24 DIAGNOSIS — E559 Vitamin D deficiency, unspecified: Secondary | ICD-10-CM | POA: Diagnosis not present

## 2019-09-24 MED ORDER — VITAMIN D (ERGOCALCIFEROL) 1.25 MG (50000 UNIT) PO CAPS: ORAL_CAPSULE | ORAL | 0 refills | Status: DC

## 2019-09-24 NOTE — Assessment & Plan Note (Signed)
Synthroid 100mg  QD

## 2019-09-24 NOTE — Progress Notes (Signed)
Subjective:    Patient ID: [name], female    DOB: 06-Feb-1956, 63 y.o.   MRN: [id number]  HPI:  [name] is here for 4 month f/u: HLD She sustained 2 L knee injuries in 8 weeks- has been unable to walk or ride bike. She has been trying to follow heart healthy diuet. She continues to abstain from tobacco/vape/ETOH use  The 10-year ASCVD risk score [name] [name]., et al., 2013) is: 3.7%   Values used to calculate the score:     Age: 24 years     Sex: Female     Is Non-Hispanic African American: No     Diabetic: No     Tobacco smoker: No     Systolic Blood Pressure: 99991111 mmHg     Is BP treated: No     HDL Cholesterol: 48 mg/dL     Total Cholesterol: 202 mg/dL  LDL-129, increased from 115 in August She remains off tobacco- but has 19 pack yr hx, quit 2017 She is adopted, unaware of any family medical hx She declined statin therapy- wants to focus on lifestyle and re-check lipids in 4 months.  Vit D def- she is currently on Ergocalciferol and daily OTC Multivitamin with Vit d. 09/21/2019 Vit D-31.6 on the above supplementation- will likely need lifelong replacement    Patient Care Team    Relationship Specialty Notifications Start End  Cassandra Grandchild, NP PCP - General Family Medicine  09/05/17   [place], Cassandra Record, MD (Inactive) Consulting Physician Orthopedic Surgery  06/04/16    Comment: L THR in Feb 2013  Cassandra Cancel, MD Consulting Physician Orthopedic Surgery  06/04/16   Cassandra Silence, MD Consulting Physician Gastroenterology  06/04/16    Comment: Hep C txmnt  Cassandra Brownie, MD Consulting Physician Dermatology  06/04/16   [name], DPM Consulting Physician Podiatry  06/04/16     Patient Active Problem List   Diagnosis Date Noted  . Elevated LDL cholesterol level 08/27/2018  . Degeneration of lumbar intervertebral disc 12/05/2017  . Acute maxillary sinusitis 12/02/2017  . Weakness of both lower extremities 09/05/2017  . Healthcare maintenance  09/05/2017  . Screening for cervical cancer 01/07/2017  . Vitamin D deficiency 07/01/2016  . Hypercholesterolemia with hypertriglyceridemia 06/26/2016  . Hypothyroidism 06/04/2016  . History of revision of total replacement of left hip joint 06/04/2016  . Insomnia 06/04/2016  . Feeling exhausted 06/04/2016  . Muscle fatigue 06/04/2016     Past Medical History:  Diagnosis Date  . Abnormal Pap smear of cervix   . Arthritis   . Blood transfusion    1968  . Blood transfusion without reported diagnosis   . Cancer ([place])    cervical  . Complication of anesthesia    sensitive to all meds- takes longer to metabolize  . 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 Hx of BLACK WIDOW SPIDER BITE x 2 left shoulder 10/11 with  "knot at base of scull"   seen by PCP- no neuro visit- resolved  . 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 Hypoglycemia   . Hypothyroidism      Past Surgical History:  Procedure Laterality Date  . CERVICAL CONE BIOPSY    . COLONOSCOPY    . EYE SURGERY     Lasic- left eye  . laser conization    . TONSILLECTOMY    . TOTAL HIP ARTHROPLASTY  10/30/2011   Procedure: TOTAL HIP ARTHROPLASTY;  Surgeon:  Cassandra Sinning, MD;  Location: [facility];  Service: Orthopedics;  Laterality: Left;     Family History  Adopted: Yes     Social History   Substance and Sexual Activity  Drug Use No   Comment: marijuana/cocaine use in college     Social History   Substance and Sexual Activity  Alcohol Use Yes   Comment: occ     Social History   Tobacco Use  Smoking Status Former Smoker  . Packs/day: 0.50  . Years: 38.00  . Pack years: 19.00  . Types: Cigarettes  . Quit date: 03/24/2016  . Years since quitting: 3.5  Smokeless Tobacco Never Used  Tobacco Comment   5-6 cigarettes day     Outpatient Encounter Medications as of 09/24/2019  Medication Sig  . Calcium 500 MG CHEW Chew 1 each by mouth at bedtime.  . Multiple Vitamin (MULTIVITAMIN  PO) Take by mouth.  . Probiotic Product (PROBIOTIC FORMULA PO) Take 1 capsule by mouth daily.  Marland Kitchen SYNTHROID 100 MCG tablet TAKE 1 TABLET DAILY  . Vitamin D, Ergocalciferol, (DRISDOL) 1.25 MG (50000 UT) CAPS capsule TAKE 1 CAPSULE BY MOUTH EVERY 7 DAYS  . [DISCONTINUED] Vitamin D, Ergocalciferol, (DRISDOL) 1.25 MG (50000 UT) CAPS capsule TAKE 1 CAPSULE BY MOUTH EVERY 7 DAYS   No facility-administered encounter medications on file as of 09/24/2019.    Allergies: Patient has no known allergies.  Body mass index is 28.24 kg/m.  Blood pressure 116/77, pulse 85, temperature 98.4 F (36.9 C), temperature source Oral, height 5' 7.5" (1.715 m), weight 183 lb (83 kg), last menstrual period 10/29/2000, SpO2 95 %.  Review of Systems  Constitutional: Positive for activity change and fatigue. Negative for appetite change, chills, diaphoresis, fever and unexpected weight change.  Eyes: Negative for visual disturbance.  Respiratory: Negative for cough, chest tightness, shortness of breath, wheezing and stridor.   Cardiovascular: Negative for chest pain, palpitations and leg swelling.  Gastrointestinal: Negative for abdominal distention, abdominal pain, blood in stool, constipation, diarrhea, nausea and vomiting.  Endocrine: Negative for polydipsia, polyphagia and polyuria.  Genitourinary: Negative for difficulty urinating and flank pain.  Musculoskeletal: Positive for arthralgias, gait problem and myalgias.  Neurological: Negative for dizziness and headaches.  Hematological: Negative for adenopathy. Does not bruise/bleed easily.  Psychiatric/Behavioral: Positive for sleep disturbance. Negative for agitation, behavioral problems, confusion, decreased concentration, dysphoric mood, hallucinations, self-injury and suicidal ideas. The patient is not nervous/anxious and is not hyperactive.        Objective:   Physical Exam Vitals and nursing note reviewed.  Constitutional:      General: She is not in  acute distress.    Appearance: Normal appearance. She is not ill-appearing, toxic-appearing or diaphoretic.  HENT:     Head: Normocephalic and atraumatic.  Eyes:     Extraocular Movements: Extraocular movements intact.     Conjunctiva/sclera: Conjunctivae normal.     Pupils: Pupils are equal, round, and reactive to light.  Cardiovascular:     Rate and Rhythm: Normal rate and regular rhythm.     Pulses: Normal pulses.     Heart sounds: Normal heart sounds. No murmur. No friction rub. No gallop.   Pulmonary:     Effort: Pulmonary effort is normal. No respiratory distress.     Breath sounds: Normal breath sounds. No stridor. No wheezing, rhonchi or rales.  Chest:     Chest wall: No tenderness.  Musculoskeletal:        General: Swelling and tenderness present.  Right knee: Normal.     Left knee: Swelling present. Tenderness present.  Skin:    General: Skin is warm and dry.     Capillary Refill: Capillary refill takes less than 2 seconds.  Neurological:     Mental Status: She is alert and oriented to person, place, and time.     Coordination: Coordination normal.  Psychiatric:        Mood and Affect: Mood normal.        Behavior: Behavior normal.        Thought Content: Thought content normal.        Judgment: Judgment normal.           Assessment & Plan:  . 1. Healthcare maintenance   2. Elevated LDL cholesterol level   3. Vitamin D deficiency   4. Hypothyroidism, unspecified type     Healthcare maintenance Continue ergocalciferol- refill sent in. Also take OTC Vit D 2,000 once daily. Increase water intake, strive for at least 90 ounces/day.   Follow Mediterranean diet. Increase regular exercise.  Recommend at least 30 minutes daily, 5 days per week of walking,biking, swimming, YouTube/Pinterest workout videos. Recommend re-checking Vit d and cholesterol panel in 4 months. If LDL cholesterol has not improved with improved diet/increased exercise- then recommend  statin therapy. Continue to social distance and wear a mask when in public. Schedule office visit in 4 months, please come fasting.  Elevated LDL cholesterol level 08/2019 Lipid Panel- The 10-year ASCVD risk score [name] Cassandra Jr., et al., 2013) is: 3.7%   Values used to calculate the score:     Age: 69 years     Sex: Female     Is Non-Hispanic African American: No     Diabetic: No     Tobacco smoker: No     Systolic Blood Pressure: 99991111 mmHg     Is BP treated: No     HDL Cholesterol: 48 mg/dL     Total Cholesterol: 202 mg/dL LDL-129, increased from 115 in 04/2019 She remains off tobacco- but has 19 pack yr hx, quit 2017 She is adopted, unaware of any family medical hx She declined statin therapy- wants to focus on lifestyle and re-check lipids in 4 months.  Vitamin D deficiency Vit D def- she is currently on Ergocalciferol and daily OTC Multivitamin with Vit d. 09/21/2019 Vit D-31.6 on the above supplementation- will likely need lifelong replacement  Hypothyroidism Synthroid 100mg  QD    FOLLOW-UP:  Return in about 4 months (around 01/22/2020) for Regular Follow Up, Fasting Labs, Hypercholestermia.

## 2019-09-24 NOTE — Patient Instructions (Signed)
Vitamin D Deficiency Vitamin D deficiency is when your body does not have enough vitamin D. Vitamin D is important to your body for many reasons:  It helps the body absorb two important minerals--calcium and phosphorus.  It plays a role in bone health.  It may help to prevent some diseases, such as diabetes and multiple sclerosis.  It plays a role in muscle function, including heart function. If vitamin D deficiency is severe, it can cause a condition in which your bones become soft. In adults, this condition is called osteomalacia. In children, this condition is called rickets. What are the causes? This condition may be caused by:  Not eating enough foods that contain vitamin D.  Not getting enough natural sun exposure.  Having certain digestive system diseases that make it difficult for your body to absorb vitamin D. These diseases include Crohn's disease, chronic pancreatitis, and cystic fibrosis.  Having a surgery in which a part of the stomach or a part of the small intestine is removed.  Having chronic kidney disease or liver disease. What increases the risk? You are more likely to develop this condition if you:  Are older.  Do not spend much time outdoors.  Live in a long-term care facility.  Have had broken bones.  Have weak or thin bones (osteoporosis).  Have a disease or condition that changes how the body absorbs vitamin D.  Have dark skin.  Take certain medicines, such as steroid medicines or certain seizure medicines.  Are overweight or obese. What are the signs or symptoms? In mild cases of vitamin D deficiency, there may not be any symptoms. If the condition is severe, symptoms may include:  Bone pain.  Muscle pain.  Falling often.  Broken bones caused by a minor injury. How is this diagnosed? This condition may be diagnosed with blood tests. Imaging tests such as X-rays may also be done to look for changes in the bone. How is this  treated? Treatment for this condition may depend on what caused the condition. Treatment options include:  Taking vitamin D supplements. Your health care provider will suggest what dose is best for you.  Taking a calcium supplement. Your health care provider will suggest what dose is best for you. Follow these instructions at home: Eating and drinking   Eat foods that contain vitamin D. Choices include: ? Fortified dairy products, cereals, or juices. Fortified means that vitamin D has been added to the food. Check the label on the package to see if the food is fortified. ? Fatty fish, such as salmon or trout. ? Eggs. ? Oysters. ? Mushrooms. The items listed above may not be a complete list of recommended foods and beverages. Contact a dietitian for more information. General instructions  Take medicines and supplements only as told by your health care provider.  Get regular, safe exposure to natural sunlight.  Do not use a tanning bed.  Maintain a healthy weight. Lose weight if needed.  Keep all follow-up visits as told by your health care provider. This is important. How is this prevented? You can get vitamin D by:  Eating foods that naturally contain vitamin D.  Eating or drinking products that have been fortified with vitamin D, such as cereals, juices, and dairy products (including milk).  Taking a vitamin D supplement or a multivitamin supplement that contains vitamin D.  Being in the sun. Your body naturally makes vitamin D when your skin is exposed to sunlight. Your body changes the sunlight into  a form of the vitamin that it can use. Contact a health care provider if:  Your symptoms do not go away.  You feel nauseous or you vomit.  You have fewer bowel movements than usual or are constipated. Summary  Vitamin D deficiency is when your body does not have enough vitamin D.  Vitamin D is important to your body for good bone health and muscle function, and it may  help prevent some diseases.  Vitamin D deficiency is primarily treated through supplementation. Your health care provider will suggest what dose is best for you.  You can get vitamin D by eating foods that contain vitamin D, by being in the sun, and by taking a vitamin D supplement or a multivitamin supplement that contains vitamin D. This information is not intended to replace advice given to you by your health care provider. Make sure you discuss any questions you have with your health care provider. Document Revised: 05/19/2018 Document Reviewed: 05/19/2018 Elsevier Patient Education  [place].   Dyslipidemia Dyslipidemia is an imbalance of waxy, fat-like substances (lipids) in the blood. The body needs lipids in small amounts. Dyslipidemia often involves a high level of cholesterol or triglycerides, which are types of lipids. Common forms of dyslipidemia include:  High levels of LDL cholesterol. LDL is the type of cholesterol that causes fatty deposits (plaques) to build up in the blood vessels that carry blood away from your heart (arteries).  Low levels of HDL cholesterol. HDL cholesterol is the type of cholesterol that protects against heart disease. High levels of HDL remove the LDL buildup from arteries.  High levels of triglycerides. Triglycerides are a fatty substance in the blood that is linked to a buildup of plaques in the arteries. What are the causes? Primary dyslipidemia is caused by changes (mutations) in genes that are passed down through families (inherited). These mutations cause several types of dyslipidemia. Secondary dyslipidemia is caused by lifestyle choices and diseases that lead to dyslipidemia, such as:  Eating a diet that is high in animal fat.  Not getting enough exercise.  Having diabetes, kidney disease, liver disease, or thyroid disease.  Drinking large amounts of alcohol.  Using certain medicines. What increases the risk? You are more  likely to develop this condition if you are an older man or if you are a woman who has gone through menopause. Other risk factors include:  Having a family history of dyslipidemia.  Taking certain medicines, including birth control pills, steroids, some diuretics, and beta-blockers.  Smoking cigarettes.  Eating a high-fat diet.  Having certain medical conditions such as diabetes, polycystic ovary syndrome (PCOS), kidney disease, liver disease, or hypothyroidism.  Not exercising regularly.  Being overweight or obese with too much belly fat. What are the signs or symptoms? In most cases, dyslipidemia does not usually cause any symptoms. In severe cases, very high lipid levels can cause:  Fatty bumps under the skin (xanthomas).  White or gray ring around the black center (pupil) of the eye. Very high triglyceride levels can cause inflammation of the pancreas (pancreatitis). How is this diagnosed? Your health care provider may diagnose dyslipidemia based on a routine blood test (fasting blood test). Because most people do not have symptoms of the condition, this blood testing (lipid profile) is done on adults age 110 and older and is repeated every 5 years. This test checks:  Total cholesterol. This measures the total amount of cholesterol in your blood, including LDL cholesterol, HDL cholesterol, and triglycerides. A  healthy number is below 200.  LDL cholesterol. The target number for LDL cholesterol is different for each person, depending on individual risk factors. Ask your health care provider what your LDL cholesterol should be.  HDL cholesterol. An HDL level of 60 or higher is best because it helps to protect against heart disease. A number below 60 for men or below 102 for women increases the risk for heart disease.  Triglycerides. A healthy triglyceride number is below 150. If your lipid profile is abnormal, your health care provider may do other blood tests. How is this  treated? Treatment depends on the type of dyslipidemia that you have and your other risk factors for heart disease and stroke. Your health care provider will have a target range for your lipid levels based on this information. For many people, this condition may be treated by lifestyle changes, such as diet and exercise. Your health care provider may recommend that you:  Get regular exercise.  Make changes to your diet.  Quit smoking if you smoke. If diet changes and exercise do not help you reach your goals, your health care provider may also prescribe medicine to lower lipids. The most commonly prescribed type of medicine lowers your LDL cholesterol (statin drug). If you have a high triglyceride level, your provider may prescribe another type of drug (fibrate) or an omega-3 fish oil supplement, or both. Follow these instructions at home:  Eating and drinking  Follow instructions from your health care provider or dietitian about eating or drinking restrictions.  Eat a healthy diet as told by your health care provider. This can help you reach and maintain a healthy weight, lower your LDL cholesterol, and raise your HDL cholesterol. This may include: ? Limiting your calories, if you are overweight. ? Eating more fruits, vegetables, whole grains, fish, and lean meats. ? Limiting saturated fat, trans fat, and cholesterol.  If you drink alcohol: ? Limit how much you use. ? Be aware of how much alcohol is in your drink. In the [place] one drink equals one 12 oz bottle of beer (355 mL), one 5 oz glass of wine (148 mL), or one 1 oz glass of hard liquor (44 mL).  Do not drink alcohol if: ? Your health care provider tells you not to drink. ? You are pregnant, may be pregnant, or are planning to become pregnant. Activity  Get regular exercise. Start an exercise and strength training program as told by your health care provider. Ask your health care provider what activities are safe for you. Your  health care provider may recommend: ? 30 minutes of aerobic activity 4-6 days a week. Brisk walking is an example of aerobic activity. ? Strength training 2 days a week. General instructions  Do not use any products that contain nicotine or tobacco, such as cigarettes, e-cigarettes, and chewing tobacco. If you need help quitting, ask your health care provider.  Take over-the-counter and prescription medicines only as told by your health care provider. This includes supplements.  Keep all follow-up visits as told by your health care provider. Contact a health care provider if:  You are: ? Having trouble sticking to your exercise or diet plan. ? Struggling to quit smoking or control your use of alcohol. Summary  Dyslipidemia often involves a high level of cholesterol or triglycerides, which are types of lipids.  Treatment depends on the type of dyslipidemia that you have and your other risk factors for heart disease and stroke.  For many people, treatment  starts with lifestyle changes, such as diet and exercise.  Your health care provider may prescribe medicine to lower lipids. This information is not intended to replace advice given to you by your health care provider. Make sure you discuss any questions you have with your health care provider. Document Revised: 05/05/2018 Document Reviewed: 04/11/2018 Elsevier Patient Education  [place].  Continue ergocalciferol- refill sent in. Also take OTC Vit D 2,000 once daily. Increase water intake, strive for at least 90 ounces/day.   Follow Mediterranean diet. Increase regular exercise.  Recommend at least 30 minutes daily, 5 days per week of walking,biking, swimming, YouTube/Pinterest workout videos. Recommend re-checking Vit d and cholesterol panel in 4 months. If LDL cholesterol has not improved with improved diet/increased exercise- then recommend statin therapy. Continue to social distance and wear a mask when in  public. Schedule office visit in 4 months, please come fasting. GREAT TO SEE YOU!

## 2019-09-24 NOTE — Assessment & Plan Note (Signed)
Vit D def- she is currently on Ergocalciferol and daily OTC Multivitamin with Vit d. 09/21/2019 Vit D-31.6 on the above supplementation- will likely need lifelong replacement

## 2019-09-24 NOTE — Assessment & Plan Note (Signed)
08/2019 Lipid Panel- The 10-year ASCVD risk score Mikey Bussing DC Jr., et al., 2013) is: 3.7%   Values used to calculate the score:     Age: 63 years     Sex: Female     Is Non-Hispanic African American: No     Diabetic: No     Tobacco smoker: No     Systolic Blood Pressure: 99991111 mmHg     Is BP treated: No     HDL Cholesterol: 48 mg/dL     Total Cholesterol: 202 mg/dL LDL-129, increased from 115 in 04/2019 She remains off tobacco- but has 19 pack yr hx, quit 2017 She is adopted, unaware of any family medical hx She declined statin therapy- wants to focus on lifestyle and re-check lipids in 4 months.

## 2019-09-24 NOTE — Assessment & Plan Note (Signed)
Continue ergocalciferol- refill sent in. Also take OTC Vit D 2,000 once daily. Increase water intake, strive for at least 90 ounces/day.   Follow Mediterranean diet. Increase regular exercise.  Recommend at least 30 minutes daily, 5 days per week of walking,biking, swimming, YouTube/Pinterest workout videos. Recommend re-checking Vit d and cholesterol panel in 4 months. If LDL cholesterol has not improved with improved diet/increased exercise- then recommend statin therapy. Continue to social distance and wear a mask when in public. Schedule office visit in 4 months, please come fasting.

## 2019-10-26 LAB — HM COLONOSCOPY

## 2019-11-05 ENCOUNTER — Ambulatory Visit: Payer: Commercial Managed Care - PPO | Attending: Internal Medicine

## 2019-11-05 DIAGNOSIS — Z23 Encounter for immunization: Secondary | ICD-10-CM | POA: Insufficient documentation

## 2019-11-05 NOTE — Progress Notes (Signed)
   Covid-19 Vaccination Clinic  Name:  [name]    MRN: [id number] DOB: Aug 21, 1956  11/05/2019  [name] was observed post Covid-19 immunization for 15 minutes without incidence. She was provided with Vaccine Information Sheet and instruction to access the V-Safe system.   [name] was instructed to call 911 with any severe reactions post vaccine: Marland Kitchen Difficulty breathing  . Swelling of your face and throat  . A fast heartbeat  . A bad rash all over your body  . Dizziness and weakness    Immunizations Administered    Name Date Dose VIS Date Route   Pfizer COVID-19 Vaccine 11/05/2019  3:16 PM 0.3 mL 09/04/2019 Intramuscular   Manufacturer: [place]   Lot: [id number]   [place]: [id number]

## 2019-11-09 ENCOUNTER — Telehealth: Payer: Self-pay | Admitting: Adult Health

## 2019-11-09 ENCOUNTER — Other Ambulatory Visit: Payer: Self-pay | Admitting: Adult Health

## 2019-11-09 MED ORDER — SYNTHROID 100 MCG PO TABS: ORAL_TABLET | ORAL | 0 refills | Status: DC

## 2019-11-09 NOTE — Addendum Note (Signed)
Addended by: Mickel Crow on: 11/09/2019 10:27 AM   Modules accepted: Orders

## 2019-11-09 NOTE — Telephone Encounter (Signed)
Patient is requesting a refill of her thyroid med. If approved please send to [facility]

## 2019-11-09 NOTE — Addendum Note (Signed)
Addended by: Mickel Crow on: 11/09/2019 10:28 AM   Modules accepted: Orders

## 2019-11-09 NOTE — Telephone Encounter (Signed)
Please advise patient this has been sent. AS, CMA

## 2019-11-28 ENCOUNTER — Ambulatory Visit: Payer: Commercial Managed Care - PPO | Attending: Internal Medicine

## 2019-11-28 DIAGNOSIS — Z23 Encounter for immunization: Secondary | ICD-10-CM

## 2019-11-28 NOTE — Progress Notes (Signed)
   Covid-19 Vaccination Clinic  Name:  [name]    MRN: [id number] DOB: 1956-03-20  11/28/2019  [name] was observed post Covid-19 immunization for 15 minutes without incident. She was provided with Vaccine Information Sheet and instruction to access the V-Safe system.   Ms. Poss was instructed to call 911 with any severe reactions post vaccine: Marland Kitchen Difficulty breathing  . Swelling of face and throat  . A fast heartbeat  . A bad rash all over body  . Dizziness and weakness   Immunizations Administered    Name Date Dose VIS Date Route   Pfizer COVID-19 Vaccine 11/28/2019  1:44 PM 0.3 mL 09/04/2019 Intramuscular   Manufacturer: [place]   Lot: [id number]   [place]: [id number]

## 2019-12-11 ENCOUNTER — Ambulatory Visit
Admission: RE | Admit: 2019-12-11 | Discharge: 2019-12-11 | Disposition: A | Discharge status: Home / Self Care | Payer: Commercial Managed Care - PPO | Source: Ambulatory Visit | Attending: Certified Nurse Midwife | Admitting: Certified Nurse Midwife

## 2019-12-11 ENCOUNTER — Other Ambulatory Visit: Payer: Self-pay

## 2019-12-11 DIAGNOSIS — Z780 Asymptomatic menopausal state: Secondary | ICD-10-CM

## 2019-12-14 ENCOUNTER — Other Ambulatory Visit: Payer: Self-pay

## 2019-12-14 DIAGNOSIS — E559 Vitamin D deficiency, unspecified: Secondary | ICD-10-CM

## 2019-12-14 DIAGNOSIS — M810 Age-related osteoporosis without current pathological fracture: Secondary | ICD-10-CM

## 2019-12-14 MED ORDER — VITAMIN D (ERGOCALCIFEROL) 1.25 MG (50000 UNIT) PO CAPS: ORAL_CAPSULE | ORAL | 0 refills | Status: DC

## 2019-12-14 NOTE — Telephone Encounter (Signed)
Pt given results of BMD and recommendations for continuing Vit D 3 50000 IU and Calcium citrate 1200 mg daily. Pt made virtual visit with Dr Talbert Nan on 12/21/2019 at 11:30 pm to discuss treatment and referral to Endocrinology.  Future orders placed. Pt states needing refill of Vit D.  Routing to D. Hollice Espy, CNM for review and refill. Rx pended for 3 month supply #12, 0RF. Pharmacy verified.

## 2019-12-15 ENCOUNTER — Encounter: Payer: Self-pay | Admitting: Certified Nurse Midwife

## 2019-12-21 ENCOUNTER — Telehealth: Payer: Commercial Managed Care - PPO | Admitting: Obstetrics and Gynecology

## 2019-12-21 ENCOUNTER — Other Ambulatory Visit: Payer: Self-pay

## 2019-12-21 ENCOUNTER — Telehealth: Payer: Self-pay | Admitting: Obstetrics and Gynecology

## 2019-12-21 NOTE — Telephone Encounter (Signed)
Spoke with patient. Patient is scheduled to see endocrinology on 01/26/20 for consult of osteoporosis. Offered patient option of rescheduling MyChart visit for BMD consult, patient agreeable, has additional questions about possible tx options. MyChart visit scheduled for 12/29/19 at 4:30pm with Dr. Talbert Nan.   Patient previously seen by Melvia Heaps, CNM AEX r/s to 09/26/20 at 2pm with Dr. Talbert Nan.   Routing to provider for final review. Patient is agreeable to disposition. Will close encounter.

## 2019-12-21 NOTE — Telephone Encounter (Signed)
Patient's MyChart appointment was canceled today due to md delay in surgery. Devonie stated that she is okay not having the consult. She is okay to proceed with the referral without having the consult.

## 2019-12-21 NOTE — Telephone Encounter (Signed)
Spoke with patient. Patient request to keep MyChart visit as scheduled.   Encounter closed.

## 2019-12-21 NOTE — Telephone Encounter (Signed)
I'm happy to speak with her if she would like, but the Endocrinologist is an expert in osteoporosis and can offer her more options than we can in the office.

## 2019-12-29 ENCOUNTER — Encounter: Payer: Self-pay | Admitting: Obstetrics and Gynecology

## 2019-12-29 ENCOUNTER — Other Ambulatory Visit: Payer: Self-pay

## 2019-12-29 ENCOUNTER — Telehealth (INDEPENDENT_AMBULATORY_CARE_PROVIDER_SITE_OTHER): Payer: Commercial Managed Care - PPO | Admitting: Obstetrics and Gynecology

## 2019-12-29 DIAGNOSIS — M810 Age-related osteoporosis without current pathological fracture: Secondary | ICD-10-CM

## 2019-12-29 MED ORDER — ALENDRONATE SODIUM 70 MG PO TABS: 70.0000 mg | ORAL_TABLET | ORAL | 3 refills | Status: DC

## 2019-12-29 NOTE — Progress Notes (Signed)
Virtual Visit via Video Note  I connected with [name] on 12/29/19 at  4:30 PM EDT by a video enabled telemedicine application and verified that I am speaking with the correct person using two identifiers.  Location: Patient: home Provider: Office at [facility]   I discussed the limitations of evaluation and management by telemedicine and the availability of in person appointments. The patient expressed understanding and agreed to proceed.  GYNECOLOGY  VISIT   HPI: 64 y.o.   Married White or Caucasian Not Hispanic or Latino  female   [id number] with Patient's last menstrual period was 10/29/2000.   Here for a virtual visit to discuss her bone density results.  DEXA from 12/11/19 returned with a T score of -2.6 in her spine.   She had dental issues, had root canals and gum surgery.  Hypothyroid on synthroid H/O vit d def, on calcium and vit D Former smoker, quit 4-5 years ago  Labs from 09/21/19 reviewed: normal CMP (including creatinine), vitamin D was just in the normal range at 31.6 Last CBC on 05/20/19 was normal.  She walks a mile a day with her dog.   She is on long term weekly vit D supplementation of 50,000 IU, only consistently taking it weekly for 6 months. She has an appointment for a vit d level is scheduled.  She recently started taking consistent calcium, 800 mg a day. Gets a lot of calcium in her diet, also getting a multivitamin.   Mild issues with reflux.    GYNECOLOGIC HISTORY: Patient's last menstrual period was 10/29/2000. Contraception:none Menopausal hormone therapy: none        OB History    Gravida  0   Para  0   Term  0   Preterm  0   AB  0   Living  0     SAB  0   TAB  0   Ectopic  0   Multiple  0   Live Births  0              Patient Active Problem List   Diagnosis Date Noted  . Elevated LDL cholesterol level 08/27/2018  . Degeneration of lumbar intervertebral disc 12/05/2017  . Acute maxillary  sinusitis 12/02/2017  . Weakness of both lower extremities 09/05/2017  . Healthcare maintenance 09/05/2017  . Screening for cervical cancer 01/07/2017  . Vitamin D deficiency 07/01/2016  . Hypercholesterolemia with hypertriglyceridemia 06/26/2016  . Hypothyroidism 06/04/2016  . History of revision of total replacement of left hip joint 06/04/2016  . Insomnia 06/04/2016  . Feeling exhausted 06/04/2016  . Muscle fatigue 06/04/2016    Past Medical History:  Diagnosis Date  . Abnormal Pap smear of cervix   . Arthritis   . Blood transfusion    1968  . Blood transfusion without reported diagnosis   . Cancer ([place])    cervical  . Complication of anesthesia    sensitive to all meds- takes longer to metabolize  . 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 Hx of BLACK WIDOW SPIDER BITE x 2 left shoulder 10/11 with  "knot at base of scull"   seen by PCP- no neuro visit- resolved  . 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 Hypoglycemia   . Hypothyroidism     Past Surgical History:  Procedure Laterality Date  . CERVICAL CONE BIOPSY    . COLONOSCOPY    . EYE SURGERY  Lasic- left eye  . laser conization    . TONSILLECTOMY    . TOTAL HIP ARTHROPLASTY  10/30/2011   Procedure: TOTAL HIP ARTHROPLASTY;  Surgeon: Magnus Sinning, MD;  Location: [facility];  Service: Orthopedics;  Laterality: Left;    Current Outpatient Medications  Medication Sig Dispense Refill  . Calcium 500 MG CHEW Chew 1 each by mouth at bedtime.    . Multiple Vitamin (MULTIVITAMIN PO) Take by mouth.    . Probiotic Product (PROBIOTIC FORMULA PO) Take 1 capsule by mouth daily.    Marland Kitchen SYNTHROID 100 MCG tablet TAKE 1 TABLET DAILY 90 tablet 0  . Vitamin D, Ergocalciferol, (DRISDOL) 1.25 MG (50000 UNIT) CAPS capsule TAKE 1 CAPSULE BY MOUTH EVERY 7 DAYS 12 capsule 0   No current facility-administered medications for this visit.     ALLERGIES: Patient has no known allergies.  Family History  Adopted: Yes     Social History   Socioeconomic History  . Marital status: Married    Spouse name: Not on file  . Number of children: Not on file  . Years of education: Not on file  . Highest education level: Not on file  Occupational History  . Not on file  Tobacco Use  . Smoking status: Former Smoker    Packs/day: 0.50    Years: 38.00    Pack years: 19.00    Types: Cigarettes    Quit date: 03/24/2016    Years since quitting: 3.7  . Smokeless tobacco: Never Used  . Tobacco comment: 5-6 cigarettes day  Substance and Sexual Activity  . Alcohol use: Yes    Comment: occ  . Drug use: No    Comment: marijuana/cocaine use in college  . Sexual activity: Not Currently    Partners: Male  Other Topics Concern  . Not on file  Social History Narrative  . Not on file   Social Determinants of Health   Financial Resource Strain:   . Difficulty of Paying Living Expenses:   Food Insecurity:   . Worried About Charity fundraiser in the Last Year:   . Arboriculturist in the Last Year:   Transportation Needs:   . Film/video editor (Medical):   Marland Kitchen Lack of Transportation (Non-Medical):   Physical Activity:   . Days of Exercise per Week:   . Minutes of Exercise per Session:   Stress:   . Feeling of Stress :   Social Connections:   . Frequency of Communication with Friends and Family:   . Frequency of Social Gatherings with Friends and Family:   . Attends Religious Services:   . Active Member of Clubs or Organizations:   . Attends Archivist Meetings:   Marland Kitchen Marital Status:   Intimate Partner Violence:   . Fear of Current or Ex-Partner:   . Emotionally Abused:   Marland Kitchen Physically Abused:   . Sexually Abused:     Review of Systems  Constitutional: Negative.   HENT: Positive for sinus pain.   Eyes: Negative.   Respiratory: Negative.   Cardiovascular: Negative.   Genitourinary: Negative.   Musculoskeletal: Positive for back pain and joint pain.  Skin: Negative.    Endo/Heme/Allergies: Positive for environmental allergies.  Psychiatric/Behavioral: The patient has insomnia.     PHYSICAL EXAMINATION:    LMP 10/29/2000     General appearance: alert, cooperative and appears stated age  ASSESSMENT Osteoporosis. She has several risk factors. H/O vit d def, on high dose vit D.  Last level was just in the normal range, she has f/u lab work later this month    PLAN Continue with 1,200 mg of calcium daily She is on high dose vit d, getting repeat level later this month Continue exercise Discussed the recommendation for treating her osteoporosis. Discussed biphosphonates and prolia. Recommend fosamax. Discussed risks of treatment and risks of not treating Start fosamax, call with any concerns Plan repeat DEXA in 2 years     I discussed the assessment and treatment plan with the patient. The patient was provided an opportunity to ask questions and all were answered. The patient agreed with the plan and demonstrated an understanding of the instructions.   Between 20-25 minutes in total patient care

## 2019-12-29 NOTE — Patient Instructions (Signed)
Osteoporosis  Osteoporosis happens when your bones get thin and weak. This can cause your bones to break (fracture) more easily. You can do things at home to make your bones stronger. Follow these instructions at home:  Activity  Exercise as told by your doctor. Ask your doctor what activities are safe for you. You should do: ? Exercises that make your muscles work to hold your body weight up (weight-bearing exercises). These include tai chi, yoga, and walking. ? Exercises to make your muscles stronger. One example is lifting weights. Lifestyle  Limit alcohol intake to no more than 1 drink a day for nonpregnant women and 2 drinks a day for men. One drink equals 12 oz of beer, 5 oz of wine, or 1 oz of hard liquor.  Do not use any products that have nicotine or tobacco in them. These include cigarettes and e-cigarettes. If you need help quitting, ask your doctor. Preventing falls  Use tools to help you move around (mobility aids) as needed. These include canes, walkers, scooters, and crutches.  Keep rooms well-lit and free of clutter.  Put away things that could make you trip. These include cords and rugs.  Install safety rails on stairs. Install grab bars in bathrooms.  Use rubber mats in slippery areas, like bathrooms.  Wear shoes that: ? Fit you well. ? Support your feet. ? Have closed toes. ? Have rubber soles or low heels.  Tell your doctor about all of the medicines you are taking. Some medicines can make you more likely to fall. General instructions  Eat plenty of calcium and vitamin D. These nutrients are good for your bones. Good sources of calcium and vitamin D include: ? Some fatty fish, such as salmon and tuna. ? Foods that have calcium and vitamin D added to them (fortified foods). For example, some breakfast cereals are fortified with calcium and vitamin D. ? Egg yolks. ? Cheese. ? Liver.  Take over-the-counter and prescription medicines only as told by your  doctor.  Keep all follow-up visits as told by your doctor. This is important. Contact a doctor if:  You have not been tested (screened) for osteoporosis and you are: ? A woman who is age 63 or older. ? A man who is age 63 or older. Get help right away if:  You fall.  You get hurt. Summary  Osteoporosis happens when your bones get thin and weak.  Weak bones can break (fracture) more easily.  Eat plenty of calcium and vitamin D. These nutrients are good for your bones.  Tell your doctor about all of the medicines that you take. This information is not intended to replace advice given to you by your health care provider. Make sure you discuss any questions you have with your health care provider. Document Revised: 08/23/2017 Document Reviewed: 07/05/2017 Elsevier Patient Education  2020 [employer]. Osteoporosis  Osteoporosis is thinning and loss of density in your bones. Osteoporosis makes bones more brittle and fragile and more likely to break (fracture). Over time, osteoporosis can cause your bones to become so weak that they fracture after a minor fall. Bones in the hip, wrist, and spine are most likely to fracture due to osteoporosis. What are the causes? The exact cause of this condition is not known. What increases the risk? You may be at greater risk for osteoporosis if you:  Have a family history of the condition.  Have poor nutrition.  Use steroid medicines, such as prednisone.  Are female.  Are  age 50 or older.  Smoke or have a history of smoking.  Are not physically active (are sedentary).  Are white (Caucasian) or of Asian descent.  Have a small body frame.  Take certain medicines, such as antiseizure medicines. What are the signs or symptoms? A fracture might be the first sign of osteoporosis, especially if the fracture results from a fall or injury that usually would not cause a bone to break. Other signs and symptoms include:  Pain in the neck or  low back.  Stooped posture.  Loss of height. How is this diagnosed? This condition may be diagnosed based on:  Your medical history.  A physical exam.  A bone mineral density test, also called a DXA or DEXA test (dual-energy X-ray absorptiometry test). This test uses X-rays to measure the amount of minerals in your bones. How is this treated? The goal of treatment is to strengthen your bones and lower your risk for a fracture. Treatment may involve:  Making lifestyle changes, such as: ? Including foods with more calcium and vitamin D in your diet. ? Doing weight-bearing and muscle-strengthening exercises. ? Stopping tobacco use. ? Limiting alcohol intake.  Taking medicine to slow the process of bone loss or to increase bone density.  Taking daily supplements of calcium and vitamin D.  Taking hormone replacement medicines, such as estrogen for women and testosterone for men.  Monitoring your levels of calcium and vitamin D. Follow these instructions at home:  Activity  Exercise as told by your health care provider. Ask your health care provider what exercises and activities are safe for you. You should do: ? Exercises that make you work against gravity (weight-bearing exercises), such as tai chi, yoga, or walking. ? Exercises to strengthen muscles, such as lifting weights. Lifestyle  Limit alcohol intake to no more than 1 drink a day for nonpregnant women and 2 drinks a day for men. One drink equals 12 oz of beer, 5 oz of wine, or 1 oz of hard liquor.  Do not use any products that contain nicotine or tobacco, such as cigarettes and e-cigarettes. If you need help quitting, ask your health care provider. Preventing falls  Use devices to help you move around (mobility aids) as needed, such as canes, walkers, scooters, or crutches.  Keep rooms well-lit and clutter-free.  Remove tripping hazards from walkways, including cords and throw rugs.  Install grab bars in bathrooms  and safety rails on stairs.  Use rubber mats in the bathroom and other areas that are often wet or slippery.  Wear closed-toe shoes that fit well and support your feet. Wear shoes that have rubber soles or low heels.  Review your medicines with your health care provider. Some medicines can cause dizziness or changes in blood pressure, which can increase your risk of falling. General instructions  Include calcium and vitamin D in your diet. Calcium is important for bone health, and vitamin D helps your body to absorb calcium. Good sources of calcium and vitamin D include: ? Certain fatty fish, such as salmon and tuna. ? Products that have calcium and vitamin D added to them (fortified products), such as fortified cereals. ? Egg yolks. ? Cheese. ? Liver.  Take over-the-counter and prescription medicines only as told by your health care provider.  Keep all follow-up visits as told by your health care provider. This is important. Contact a health care provider if:  You have never been screened for osteoporosis and you are: ? A woman who  is age 2 or older. ? A man who is age 43 or older. Get help right away if:  You fall or injure yourself. Summary  Osteoporosis is thinning and loss of density in your bones. This makes bones more brittle and fragile and more likely to break (fracture),even with minor falls.  The goal of treatment is to strengthen your bones and reduce your risk for a fracture.  Include calcium and vitamin D in your diet. Calcium is important for bone health, and vitamin D helps your body to absorb calcium.  Talk with your health care provider about screening for osteoporosis if you are a woman who is age 56 or older, or a man who is age 47 or older. This information is not intended to replace advice given to you by your health care provider. Make sure you discuss any questions you have with your health care provider. Document Revised: 08/23/2017 Document Reviewed:  07/05/2017 Elsevier Patient Education  2020 [employer].

## 2020-01-12 ENCOUNTER — Other Ambulatory Visit: Payer: Self-pay | Admitting: Family Medicine

## 2020-01-18 ENCOUNTER — Other Ambulatory Visit: Payer: Self-pay

## 2020-01-18 ENCOUNTER — Other Ambulatory Visit: Payer: Commercial Managed Care - PPO

## 2020-01-18 DIAGNOSIS — E782 Mixed hyperlipidemia: Secondary | ICD-10-CM

## 2020-01-19 ENCOUNTER — Telehealth: Payer: Self-pay | Admitting: Physician Assistant

## 2020-01-19 LAB — LIPID PANEL
Chol/HDL Ratio: 4.8 ratio — ABNORMAL HIGH (ref 0.0–4.4)
Cholesterol, Total: 195 mg/dL (ref 100–199)
HDL: 41 mg/dL (ref >39)
LDL Chol Calc (NIH): 117 mg/dL — ABNORMAL HIGH (ref 0–99)
Triglycerides: 212 mg/dL — ABNORMAL HIGH (ref 0–149)
VLDL Cholesterol Cal: 37 mg/dL (ref 5–40)

## 2020-01-19 NOTE — Telephone Encounter (Signed)
Patient was in for labs yesterday prior to her Thurs OV. She saw her results on MyChart and states that there should have been additional labs drawn. She is requesting a call back from clinic staff to discuss so that she can make sure provider has all labs needed for televisit on Thurs.

## 2020-01-19 NOTE — Telephone Encounter (Signed)
Called [facility] and spoke with Porscha to add on Vitamin D level as confirmed from last OV note.  Charyl Bigger, CMA

## 2020-01-20 LAB — SPECIMEN STATUS REPORT

## 2020-01-20 LAB — VITAMIN D 25 HYDROXY (VIT D DEFICIENCY, FRACTURES): Vit D, 25-Hydroxy: 32.7 ng/mL (ref 30.0–100.0)

## 2020-01-21 ENCOUNTER — Encounter: Payer: Self-pay | Admitting: Physician Assistant

## 2020-01-21 ENCOUNTER — Ambulatory Visit (INDEPENDENT_AMBULATORY_CARE_PROVIDER_SITE_OTHER): Payer: Commercial Managed Care - PPO | Admitting: Physician Assistant

## 2020-01-21 ENCOUNTER — Other Ambulatory Visit: Payer: Self-pay

## 2020-01-21 VITALS — BP 127/75 | HR 76 | Ht 67.5 in | Wt 180.0 lb

## 2020-01-21 DIAGNOSIS — E559 Vitamin D deficiency, unspecified: Secondary | ICD-10-CM | POA: Diagnosis not present

## 2020-01-21 DIAGNOSIS — E782 Mixed hyperlipidemia: Secondary | ICD-10-CM

## 2020-01-21 DIAGNOSIS — E039 Hypothyroidism, unspecified: Secondary | ICD-10-CM | POA: Diagnosis not present

## 2020-01-21 DIAGNOSIS — M62838 Other muscle spasm: Secondary | ICD-10-CM

## 2020-01-21 DIAGNOSIS — R5383 Other fatigue: Secondary | ICD-10-CM

## 2020-01-21 DIAGNOSIS — M791 Myalgia, unspecified site: Secondary | ICD-10-CM

## 2020-01-21 DIAGNOSIS — R7302 Impaired glucose tolerance (oral): Secondary | ICD-10-CM

## 2020-01-21 NOTE — Progress Notes (Addendum)
Established Patient Virtual Visit  Subjective:  Patient ID: [name], female    DOB: 23-Dec-1955  Age: 64 y.o. MRN: [id number]  CC:  Chief Complaint  Patient presents with  . Hyperlipidemia   Virtual Visit via Video Note  I connected with [name] on 01/21/2020 by a video enabled telemedicine application and verified that I am speaking with the correct person using two identifiers.  . Location of the patient: Home  . Location of the provider: Office  I discussed the limitations of evaluation and management by telemedicine and the availability of in person appointments. The patient expressed understanding and agreed to proceed.  HPI [name] presents via video visit for 4 month follow-up. Pt reports her muscle ache symptoms are the same. She walks one mile daily and sometimes she's "hurting so bad and just limps down the street". Sometimes she also has muscle cramps with her runs. She stays hydrated and has tried to be diligent with her diet which includes vegetables, oatmeal, fruit, low fat and glucose. States she feels like she keeps gaining weight and expresses her frustration given she has made dietary and lifestyle changes. Reports fatigue and can't seem to regulate her body temperature. Denies increased thirst or urination. She is adopted so she doesn't know her family history.   Past Medical History:  Diagnosis Date  . Abnormal Pap smear of cervix   . Arthritis   . Blood transfusion    1968  . Blood transfusion without reported diagnosis   . Cancer ([place])    cervical  . Complication of anesthesia    sensitive to all meds- takes longer to metabolize  . 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 Hx of BLACK WIDOW SPIDER BITE x 2 left shoulder 10/11 with  "knot at base of scull"   seen by PCP- no neuro visit- resolved  . 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 Hypoglycemia   . Hypothyroidism   . Osteoporosis     Past Surgical  History:  Procedure Laterality Date  . CERVICAL CONE BIOPSY    . COLONOSCOPY    . EYE SURGERY     Lasic- left eye  . laser conization    . TONSILLECTOMY    . TOTAL HIP ARTHROPLASTY  10/30/2011   Procedure: TOTAL HIP ARTHROPLASTY;  Surgeon: Magnus Sinning, MD;  Location: [facility];  Service: Orthopedics;  Laterality: Left;    Family History  Adopted: Yes    Social History   Socioeconomic History  . Marital status: Married    Spouse name: Not on file  . Number of children: Not on file  . Years of education: Not on file  . Highest education level: Not on file  Occupational History  . Not on file  Tobacco Use  . Smoking status: Former Smoker    Packs/day: 0.50    Years: 38.00    Pack years: 19.00    Types: Cigarettes    Quit date: 03/24/2016    Years since quitting: 3.8  . Smokeless tobacco: Never Used  . Tobacco comment: 5-6 cigarettes day  Substance and Sexual Activity  . Alcohol use: Yes    Comment: occ  . Drug use: No    Comment: marijuana/cocaine use in college  . Sexual activity: Not Currently    Partners: Male  Other Topics Concern  . Not on file  Social History Narrative  . Not on file   Social Determinants of Health  Financial Resource Strain:   . Difficulty of Paying Living Expenses:   Food Insecurity:   . Worried About Charity fundraiser in the Last Year:   . Arboriculturist in the Last Year:   Transportation Needs:   . Film/video editor (Medical):   Marland Kitchen Lack of Transportation (Non-Medical):   Physical Activity:   . Days of Exercise per Week:   . Minutes of Exercise per Session:   Stress:   . Feeling of Stress :   Social Connections:   . Frequency of Communication with Friends and Family:   . Frequency of Social Gatherings with Friends and Family:   . Attends Religious Services:   . Active Member of Clubs or Organizations:   . Attends Archivist Meetings:   Marland Kitchen Marital Status:   Intimate Partner Violence:   . Fear of Current or  Ex-Partner:   . Emotionally Abused:   Marland Kitchen Physically Abused:   . Sexually Abused:     Outpatient Medications Prior to Visit  Medication Sig Dispense Refill  . calcium-vitamin D (OSCAL WITH D) 500-200 MG-UNIT tablet Take 1 tablet by mouth.    . Multiple Vitamin (MULTIVITAMIN PO) Take by mouth.    . Probiotic Product (PROBIOTIC FORMULA PO) Take 1 capsule by mouth daily.    Marland Kitchen SYNTHROID 100 MCG tablet TAKE 1 TABLET DAILY 30 tablet 0  . Vitamin D, Ergocalciferol, (DRISDOL) 1.25 MG (50000 UNIT) CAPS capsule TAKE 1 CAPSULE BY MOUTH EVERY 7 DAYS 12 capsule 0  . alendronate (FOSAMAX) 70 MG tablet Take 1 tablet (70 mg total) by mouth every 7 (seven) days. Take first thing in am with 6 oz. Water.  Be upright after taking.  Eat nothing for one hour. (Patient not taking: Reported on 01/21/2020) 12 tablet 3   No facility-administered medications prior to visit.    No Known Allergies  ROS Review of Systems  Review of Systems:  A fourteen system review of systems was performed and found to be positive as per HPI.    Objective:    Physical Exam General: Well developed, in no apparent distress. Resp: Respiratory effort- normal. Speaking clearly in complete sentences without any shortness of breath. Neuro: Alert, Oriented Psych: Normal affect, Insight and Judgment appropriate.   BP 127/75   Pulse 76   Ht 5' 7.5" (1.715 m)   Wt 180 lb (81.6 kg)   LMP 10/29/2000   BMI 27.78 kg/m  Wt Readings from Last 3 Encounters:  01/21/20 180 lb (81.6 kg)  09/24/19 183 lb (83 kg)  09/11/19 180 lb (81.6 kg)     Health Maintenance Due  Topic Date Due  . HIV Screening  Never done    There are no preventive care reminders to display for this patient.  Lab Results  Component Value Date   TSH 3.110 05/20/2019   Lab Results  Component Value Date   WBC 6.9 05/20/2019   HGB 15.4 05/20/2019   HCT 44.5 05/20/2019   MCV 86 05/20/2019   PLT 313 05/20/2019   Lab Results  Component Value Date   NA  142 09/21/2019   K 4.8 09/21/2019   CO2 24 09/21/2019   GLUCOSE 98 09/21/2019   BUN 16 09/21/2019   CREATININE 0.77 09/21/2019   BILITOT 0.6 09/21/2019   ALKPHOS 60 09/21/2019   AST 18 09/21/2019   ALT 21 09/21/2019   PROT 7.1 09/21/2019   ALBUMIN 4.6 09/21/2019   CALCIUM 10.1 09/21/2019   Lab  Results  Component Value Date   CHOL 195 01/18/2020   Lab Results  Component Value Date   HDL 41 01/18/2020   Lab Results  Component Value Date   LDLCALC 117 (H) 01/18/2020   Lab Results  Component Value Date   TRIG 212 (H) 01/18/2020   Lab Results  Component Value Date   CHOLHDL 4.8 (H) 01/18/2020   Lab Results  Component Value Date   HGBA1C 5.7 (H) 05/20/2019      Assessment & Plan:   Problem List Items Addressed This Visit      Endocrine   Hypothyroidism (Chronic)   Relevant Orders   TSH     Other   Feeling exhausted (Chronic)   Relevant Orders   TSH   Hypercholesterolemia with hypertriglyceridemia - Primary (Chronic)   Vitamin D deficiency    Other Visit Diagnoses    Impaired glucose tolerance       Relevant Orders   HgB A1c   Muscle spasm       Relevant Orders   Comp Met (CMET)   Magnesium     Hypercholesteremia:  - Last lipid panel: LDL elevated at 117, slightly improved from 4 months ago (129).  - Pt reports chronic muscle aches and intermittent muscle spasms so will hold off on starting statin therapy at this time. - Encourage to continue heart healthy diet low in cholesterol/sat and trans fat. - Encourage to stay as active as possible.   Hypothyroid: - Last TSH (8 mons ago) wnl's. - Pt symptomatic and will place order to recheck TSH. - Continue Synthroid 100 MCG and pending lab results will adjust dose if necessary.  Vitamin D deficiency:  - Will contact [facility] to add Vitamin D level to existing order.  - Continue Vitamin D.  Impaired glucose tolerance: - Last A1c 5.7, prediabetic range.  - Will place order to recheck A1c. - Continue  low carb diet and exercise.  Myalgias, muscle cramps: - Will place order for CMP, TSH and Magnesium to evaluate for possible etiologies. - Recommend to continue stretches and adequate hydration- at least 64 fl ounces.    Fatigue: - Will place order for CBC, CMP and TSH to evaluate for possible etiologies.    No orders of the defined types were placed in this encounter.   Follow-up: Return for HLD, hypothyroid, Vit D def in 4-6 months.    Lorrene Reid, PA-C

## 2020-01-29 ENCOUNTER — Ambulatory Visit: Payer: Commercial Managed Care - PPO | Admitting: Internal Medicine

## 2020-03-18 ENCOUNTER — Other Ambulatory Visit: Payer: Self-pay | Admitting: Family Medicine

## 2020-03-22 ENCOUNTER — Telehealth: Payer: Self-pay | Admitting: Physician Assistant

## 2020-03-22 DIAGNOSIS — E559 Vitamin D deficiency, unspecified: Secondary | ICD-10-CM

## 2020-03-22 MED ORDER — SYNTHROID 100 MCG PO TABS: 100.0000 ug | ORAL_TABLET | Freq: Every day | ORAL | 0 refills | Status: DC

## 2020-03-22 MED ORDER — VITAMIN D (ERGOCALCIFEROL) 1.25 MG (50000 UNIT) PO CAPS: ORAL_CAPSULE | ORAL | 0 refills | Status: DC

## 2020-03-22 NOTE — Telephone Encounter (Signed)
Refill sent to pharmacy. AS< CMA 

## 2020-03-22 NOTE — Telephone Encounter (Signed)
Patient is requesting a refill of her thyroid med and Vit D. She is also requesting a 3 mnth supply for insurance payment/guidelines. If approved please send to [facility]

## 2020-03-22 NOTE — Addendum Note (Signed)
Addended by: Mickel Crow on: 03/22/2020 10:44 AM   Modules accepted: Orders

## 2020-06-08 ENCOUNTER — Other Ambulatory Visit: Payer: Self-pay | Admitting: Physician Assistant

## 2020-06-08 DIAGNOSIS — E7800 Pure hypercholesterolemia, unspecified: Secondary | ICD-10-CM

## 2020-06-08 DIAGNOSIS — E039 Hypothyroidism, unspecified: Secondary | ICD-10-CM

## 2020-06-08 DIAGNOSIS — Z0000 Encounter for general adult medical examination without abnormal findings: Secondary | ICD-10-CM

## 2020-06-08 DIAGNOSIS — E782 Mixed hyperlipidemia: Secondary | ICD-10-CM

## 2020-06-08 DIAGNOSIS — E559 Vitamin D deficiency, unspecified: Secondary | ICD-10-CM

## 2020-06-09 ENCOUNTER — Other Ambulatory Visit: Payer: Self-pay

## 2020-06-09 ENCOUNTER — Other Ambulatory Visit: Payer: Commercial Managed Care - PPO

## 2020-06-09 DIAGNOSIS — E039 Hypothyroidism, unspecified: Secondary | ICD-10-CM

## 2020-06-09 DIAGNOSIS — Z0000 Encounter for general adult medical examination without abnormal findings: Secondary | ICD-10-CM

## 2020-06-09 DIAGNOSIS — M791 Myalgia, unspecified site: Secondary | ICD-10-CM

## 2020-06-09 DIAGNOSIS — E782 Mixed hyperlipidemia: Secondary | ICD-10-CM

## 2020-06-09 DIAGNOSIS — E7800 Pure hypercholesterolemia, unspecified: Secondary | ICD-10-CM

## 2020-06-09 DIAGNOSIS — E559 Vitamin D deficiency, unspecified: Secondary | ICD-10-CM

## 2020-06-10 LAB — CBC
Hematocrit: 46.9 % — ABNORMAL HIGH (ref 34.0–46.6)
Hemoglobin: 15.4 g/dL (ref 11.1–15.9)
MCH: 29.9 pg (ref 26.6–33.0)
MCHC: 32.8 g/dL (ref 31.5–35.7)
MCV: 91 fL (ref 79–97)
Platelets: 324 10*3/uL (ref 150–450)
RBC: 5.15 x10E6/uL (ref 3.77–5.28)
RDW: 12.5 % (ref 11.7–15.4)
WBC: 7.4 10*3/uL (ref 3.4–10.8)

## 2020-06-10 LAB — LIPID PANEL
Chol/HDL Ratio: 4.4 ratio (ref 0.0–4.4)
Cholesterol, Total: 210 mg/dL — ABNORMAL HIGH (ref 100–199)
HDL: 48 mg/dL (ref >39)
LDL Chol Calc (NIH): 126 mg/dL — ABNORMAL HIGH (ref 0–99)
Triglycerides: 201 mg/dL — ABNORMAL HIGH (ref 0–149)
VLDL Cholesterol Cal: 36 mg/dL (ref 5–40)

## 2020-06-10 LAB — COMPREHENSIVE METABOLIC PANEL
ALT: 20 IU/L (ref 0–32)
AST: 17 IU/L (ref 0–40)
Albumin/Globulin Ratio: 1.5 (ref 1.2–2.2)
Albumin: 4.6 g/dL (ref 3.8–4.8)
Alkaline Phosphatase: 67 IU/L (ref 44–121)
BUN/Creatinine Ratio: 16 (ref 12–28)
BUN: 15 mg/dL (ref 8–27)
Bilirubin Total: 0.7 mg/dL (ref 0.0–1.2)
CO2: 25 mmol/L (ref 20–29)
Calcium: 10.3 mg/dL (ref 8.7–10.3)
Chloride: 102 mmol/L (ref 96–106)
Creatinine, Ser: 0.94 mg/dL (ref 0.57–1.00)
GFR calc Af Amer: 75 mL/min/{1.73_m2} (ref >59)
GFR calc non Af Amer: 65 mL/min/{1.73_m2} (ref >59)
Globulin, Total: 3 g/dL (ref 1.5–4.5)
Glucose: 99 mg/dL (ref 65–99)
Potassium: 5 mmol/L (ref 3.5–5.2)
Sodium: 142 mmol/L (ref 134–144)
Total Protein: 7.6 g/dL (ref 6.0–8.5)

## 2020-06-10 LAB — T3: T3, Total: 111 ng/dL (ref 71–180)

## 2020-06-10 LAB — HEMOGLOBIN A1C
Est. average glucose Bld gHb Est-mCnc: 120 mg/dL
Hgb A1c MFr Bld: 5.8 % — ABNORMAL HIGH (ref 4.8–5.6)

## 2020-06-10 LAB — TSH: TSH: 2.47 u[IU]/mL (ref 0.450–4.500)

## 2020-06-10 LAB — VITAMIN D 25 HYDROXY (VIT D DEFICIENCY, FRACTURES): Vit D, 25-Hydroxy: 32.6 ng/mL (ref 30.0–100.0)

## 2020-06-10 LAB — T4, FREE: Free T4: 1.3 ng/dL (ref 0.82–1.77)

## 2020-06-10 LAB — MAGNESIUM: Magnesium: 2.2 mg/dL (ref 1.6–2.3)

## 2020-06-14 ENCOUNTER — Other Ambulatory Visit: Payer: Self-pay

## 2020-06-14 ENCOUNTER — Ambulatory Visit (INDEPENDENT_AMBULATORY_CARE_PROVIDER_SITE_OTHER): Payer: Commercial Managed Care - PPO | Admitting: Physician Assistant

## 2020-06-14 VITALS — BP 122/77 | HR 72 | Ht 67.5 in | Wt 183.0 lb

## 2020-06-14 DIAGNOSIS — Z0000 Encounter for general adult medical examination without abnormal findings: Secondary | ICD-10-CM | POA: Diagnosis not present

## 2020-06-14 DIAGNOSIS — M5136 Other intervertebral disc degeneration, lumbar region: Secondary | ICD-10-CM

## 2020-06-14 DIAGNOSIS — E782 Mixed hyperlipidemia: Secondary | ICD-10-CM

## 2020-06-14 DIAGNOSIS — Z1231 Encounter for screening mammogram for malignant neoplasm of breast: Secondary | ICD-10-CM | POA: Diagnosis not present

## 2020-06-14 DIAGNOSIS — H6122 Impacted cerumen, left ear: Secondary | ICD-10-CM

## 2020-06-14 DIAGNOSIS — R29898 Other symptoms and signs involving the musculoskeletal system: Secondary | ICD-10-CM

## 2020-06-14 NOTE — Progress Notes (Signed)
Female Physical   Impression and Recommendations:    1. Healthcare maintenance   2. Encounter for screening mammogram for malignant neoplasm of breast   3. Hypercholesterolemia with hypertriglyceridemia   4. Degeneration of lumbar intervertebral disc   5. Weakness of both lower extremities      1) Anticipatory Guidance: Discussed skin CA prevention and sunscreen when outside along with skin surveillance; eating a balanced and modest diet; physical activity at least 25 minutes per day or minimum of 150 min/ week moderate to intense activity.  2) Immunizations / Screenings / Labs:   All immunizations are up-to-date per recommendations or will be updated today if pt allows.    - Patient understands with dental and vision screens they will schedule independently.  - Obtained CBC, CMP, HgA1c, Lipid panel, TSH and vit D when fasting. - Placed mammogram order. - UTD on pap smear, colonoscopy, Tdap, HIV and Hep C screenings.  3) Weight:  BMI meaning discussed with patient.  Discussed goal to improve diet habits to improve overall feelings of well being and objective health data. Improve nutrient density of diet through increasing intake of fruits and vegetables and decreasing saturated fats, white flour products and refined sugars.  4)Healthcare Maintenance: -Recent labs were essentially wnl or stable from prior. Lipid panel- total cholesterol and bad cholesterol increased from prior, Triglycerides improved. A1c increased from prior. Patient has chronic lower extremity weakness and ASCVD risk score is on the lower end so will hold off on starting statin therapy at this time in an effort to reduce the risk of exacerbating current symptoms. Advised to continue heart healthy diet and reduce carbohydrates.  The 10-year ASCVD risk score [name], et al., 2013) is: 4.6%   Values used to calculate the score:     Age: 64 years     Sex: Female     Is Non-Hispanic African American: No      Diabetic: No     Tobacco smoker: No     Systolic Blood Pressure: 127 mmHg     Is BP treated: No     HDL Cholesterol: 48 mg/dL     Total Cholesterol: 210 mg/dL -Continue current medication regimen. -Will place order for lumbar xray to evaluate for bony abnormalities possibly contributing to lower extremity weakness. Pt has hx of degenerative disc disease. Recommend further evaluation for other etiologies such as autoimmune conditions or referral to Neurology. -Stay well hydrated -Follow up in 4 months for reg OV: HLD, Hypothyroid and FBW to recheck lipid panel, cmp, cbc  No orders of the defined types were placed in this encounter.   Orders Placed This Encounter  Procedures  . MM Digital Screening  . DG Lumbar Spine Complete     Return in about 4 months (around 10/14/2020) for HTN, Thyroid with FBW (lipid panel, cmp, cbc) a few days before.       Gross side effects, risk and benefits, and alternatives of medications discussed with patient.  Patient is aware that all medications have potential side effects and we are unable to predict every side effect or drug-drug interaction that may occur.  Expresses verbal understanding and consents to current therapy plan and treatment regimen.  F-up preventative CPE in 1 year- reminded pt again, this is in addition to any chronic care visits.    Please see orders placed and AVS handed out to patient at the end of our visit for further patient instructions/ counseling done pertaining to today's office visit.  Subjective:     CPE HPI: [name] is a 64 y.o. female who presents to [place] at [facility] today for a yearly health maintenance exam.   Health Maintenance Summary  - Reviewed and updated, unless pt declines services.  Last Cologuard or Colonoscopy:  10/26/19- repeat in 10 yrs Family history of Colon CA: Unknown- pt is adopted  Tobacco History Reviewed:  Y, former smoker w/ 19 pck yr hx Alcohol  and/or drug use:    No concerns; no use Exercise Habits:  Limited due to chronic lower extremity weakness Dental Home: Y Female Health:  PAP Smear - last known results:  09/09/2018- normal, Followed by Ob-Gyn STD concerns:   none Breast Cancer Family History: Unknown    Immunization History  Administered Date(s) Administered  . PFIZER SARS-COV-2 Vaccination 11/05/2019, 11/28/2019  . Tdap 09/24/2005, 01/07/2017     Health Maintenance  Topic Date Due  . HIV Screening  Never done  . INFLUENZA VACCINE  Never done  . MAMMOGRAM  02/24/2021  . PAP SMEAR-Modifier  09/09/2021  . TETANUS/TDAP  01/08/2027  . COLONOSCOPY  10/25/2029  . COVID-19 Vaccine  Completed  . Hepatitis C Screening  Completed     Wt Readings from Last 3 Encounters:  06/14/20 183 lb (83 kg)  01/21/20 180 lb (81.6 kg)  09/24/19 183 lb (83 kg)   BP Readings from Last 3 Encounters:  06/14/20 122/77  01/21/20 127/75  09/24/19 116/77   Pulse Readings from Last 3 Encounters:  06/14/20 72  01/21/20 76  09/24/19 85     Past Medical History:  Diagnosis Date  . Abnormal Pap smear of cervix   . Arthritis   . Blood transfusion    1968  . Blood transfusion without reported diagnosis   . Cancer ([place])    cervical  . Complication of anesthesia    sensitive to all meds- takes longer to metabolize  . 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 Hx of BLACK WIDOW SPIDER BITE x 2 left shoulder 10/11 with  "knot at base of scull"   seen by PCP- no neuro visit- resolved  . 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 Hypoglycemia   . Hypothyroidism   . Osteoporosis       Past Surgical History:  Procedure Laterality Date  . CERVICAL CONE BIOPSY    . COLONOSCOPY    . EYE SURGERY     Lasic- left eye  . JOINT REPLACEMENT N/A    Phreesia 06/14/2020  . laser conization    . TONSILLECTOMY    . TOTAL HIP ARTHROPLASTY  10/30/2011   Procedure: TOTAL HIP ARTHROPLASTY;  Surgeon: Magnus Sinning, MD;   Location: [facility];  Service: Orthopedics;  Laterality: Left;      Family History  Adopted: Yes      Social History   Substance and Sexual Activity  Drug Use No   Comment: marijuana/cocaine use in college  ,   Social History   Substance and Sexual Activity  Alcohol Use Yes   Comment: occ  ,   Social History   Tobacco Use  Smoking Status Former Smoker  . Packs/day: 0.50  . Years: 38.00  . Pack years: 19.00  . Types: Cigarettes  . Quit date: 03/24/2016  . Years since quitting: 4.2  Smokeless Tobacco Never Used  Tobacco Comment   5-6 cigarettes day  ,   Social History   Substance and  Sexual Activity  Sexual Activity Not Currently  . Partners: Male    Current Outpatient Medications on File Prior to Visit  Medication Sig Dispense Refill  . calcium-vitamin D (OSCAL WITH D) 500-200 MG-UNIT tablet Take 1 tablet by mouth.    . Multiple Vitamin (MULTIVITAMIN PO) Take by mouth.    . Probiotic Product (PROBIOTIC FORMULA PO) Take 1 capsule by mouth daily.    Marland Kitchen SYNTHROID 100 MCG tablet Take 1 tablet (100 mcg total) by mouth daily. 90 tablet 0  . Vitamin D, Ergocalciferol, (DRISDOL) 1.25 MG (50000 UNIT) CAPS capsule TAKE 1 CAPSULE BY MOUTH EVERY 7 DAYS 12 capsule 0  . alendronate (FOSAMAX) 70 MG tablet Take 1 tablet (70 mg total) by mouth every 7 (seven) days. Take first thing in am with 6 oz. Water.  Be upright after taking.  Eat nothing for one hour. (Patient not taking: Reported on 01/21/2020) 12 tablet 3   No current facility-administered medications on file prior to visit.    Allergies: Patient has no known allergies.  Review of Systems: General:   Denies fever, chills, unexplained weight loss.  Optho/Auditory:   Denies visual changes, blurred vision/LOV Respiratory:   Denies SOB, DOE more than baseline levels.   Cardiovascular:   Denies chest pain, palpitations, +peripheral edema  Gastrointestinal:   Denies nausea, vomiting, diarrhea.  Genitourinary: Denies  dysuria, freq/ urgency, flank pain  Endocrine:     Denies hot or cold intolerance, polyuria, polydipsia. Musculoskeletal:   Denies joint swelling Skin:  Denies rash, suspicious lesions Neurological:     Denies dizziness, unexplained weakness, numbness  Psychiatric/Behavioral:   Denies mood changes, suicidal or homicidal ideations, hallucinations    Objective:    Blood pressure 122/77, pulse 72, height 5' 7.5" (1.715 m), weight 183 lb (83 kg), last menstrual period 10/29/2000, SpO2 97 %. Body mass index is 28.24 kg/m. General Appearance:    Alert, cooperative, no distress, appears stated age  Head:    Normocephalic, without obvious abnormality, atraumatic  Eyes:    PERRL, conjunctiva/corneas clear, EOM's intact, both eyes  Ears:    Normal TM and external ear canals of right ear; Cerumen impaction of left ear  Nose:   Nares normal, septum midline, mucosa normal, no drainage    or sinus tenderness  Throat:   Lips w/o lesion, mucosa moist, and tongue normal; teeth and   gums normal  Neck:   Supple, symmetrical, trachea midline, no adenopathy;    thyroid:  no enlargement/tenderness; no JVD  Back:     Symmetric, no CVA tenderness  Lungs:     Clear to auscultation bilaterally, respirations unlabored, no       Wh/ R/ R  Chest Wall:    No tenderness or gross deformity; normal excursion   Heart:    Regular rate and rhythm, S1 and S2 normal, no murmur, rub   or gallop  Breast Exam:    Deferred to Ob-Gyn  Abdomen:     Soft, non-tender, bowel sounds active all four quadrants, NO   G/R/R, no masses, no organomegaly  Genitalia:   Deferred to Ob-Gyn  Rectal:   Deferred to Ob-Gyn  Extremities:   Extremities normal, atraumatic, no cyanosis or gross edema  Pulses:   Normal  Skin:   Warm, dry, Skin color, texture, turgor normal, no obvious rashes or lesions Psych: No HI/SI, judgement and insight good, Euthymic mood. Full Affect.  Neurologic:   CNII-XII grossly intact, normal strength, sensation to  light touch

## 2020-06-14 NOTE — Patient Instructions (Signed)

## 2020-06-16 ENCOUNTER — Other Ambulatory Visit: Payer: Self-pay | Admitting: Physician Assistant

## 2020-06-16 ENCOUNTER — Other Ambulatory Visit: Payer: Self-pay

## 2020-06-16 ENCOUNTER — Ambulatory Visit
Admission: RE | Admit: 2020-06-16 | Discharge: 2020-06-16 | Disposition: A | Discharge status: Home / Self Care | Payer: Commercial Managed Care - PPO | Source: Ambulatory Visit | Attending: Physician Assistant | Admitting: Physician Assistant

## 2020-06-16 DIAGNOSIS — M5136 Other intervertebral disc degeneration, lumbar region: Secondary | ICD-10-CM

## 2020-06-30 ENCOUNTER — Telehealth: Payer: Self-pay | Admitting: Physician Assistant

## 2020-06-30 DIAGNOSIS — E559 Vitamin D deficiency, unspecified: Secondary | ICD-10-CM

## 2020-06-30 MED ORDER — VITAMIN D (ERGOCALCIFEROL) 1.25 MG (50000 UNIT) PO CAPS: ORAL_CAPSULE | ORAL | 1 refills | Status: DC

## 2020-06-30 NOTE — Addendum Note (Signed)
Addended by: Mickel Crow on: 06/30/2020 02:32 PM   Modules accepted: Orders

## 2020-06-30 NOTE — Telephone Encounter (Signed)
Refill sent to pharmacy. AS< CMA 

## 2020-06-30 NOTE — Telephone Encounter (Signed)
Patient said that she thought at last OV she would get a refill of her Vit D. If this still approved please send to [facility] please.

## 2020-07-13 ENCOUNTER — Ambulatory Visit
Admission: RE | Admit: 2020-07-13 | Discharge: 2020-07-13 | Disposition: A | Discharge status: Home / Self Care | Payer: Commercial Managed Care - PPO | Source: Ambulatory Visit | Attending: Physician Assistant | Admitting: Physician Assistant

## 2020-07-13 ENCOUNTER — Other Ambulatory Visit: Payer: Self-pay

## 2020-07-13 ENCOUNTER — Other Ambulatory Visit: Payer: Self-pay | Admitting: Physician Assistant

## 2020-07-13 DIAGNOSIS — Z1231 Encounter for screening mammogram for malignant neoplasm of breast: Secondary | ICD-10-CM

## 2020-09-12 ENCOUNTER — Ambulatory Visit: Payer: Commercial Managed Care - PPO | Admitting: Certified Nurse Midwife

## 2020-09-26 ENCOUNTER — Ambulatory Visit: Payer: Self-pay | Admitting: Obstetrics and Gynecology

## 2020-10-06 ENCOUNTER — Telehealth: Payer: Self-pay | Admitting: Physician Assistant

## 2020-10-06 NOTE — Telephone Encounter (Signed)
requested 06/16/20 xray images be emailed to [email or web address] and [email or web address]. Completed

## 2020-11-20 ENCOUNTER — Other Ambulatory Visit: Payer: Self-pay | Admitting: Physician Assistant

## 2021-02-08 ENCOUNTER — Other Ambulatory Visit: Payer: Self-pay

## 2021-02-08 ENCOUNTER — Ambulatory Visit
Admission: EM | Admit: 2021-02-08 | Discharge: 2021-02-08 | Discharge status: Against Medical Advice | Payer: Commercial Managed Care - PPO

## 2021-02-08 ENCOUNTER — Encounter: Payer: Self-pay | Admitting: Nurse Practitioner

## 2021-02-08 ENCOUNTER — Telehealth: Payer: Self-pay | Admitting: Physician Assistant

## 2021-02-08 ENCOUNTER — Ambulatory Visit (INDEPENDENT_AMBULATORY_CARE_PROVIDER_SITE_OTHER): Payer: Commercial Managed Care - PPO | Admitting: Nurse Practitioner

## 2021-02-08 VITALS — BP 115/72 | HR 87 | Temp 98.1°F | Ht 68.0 in | Wt 178.7 lb

## 2021-02-08 DIAGNOSIS — J301 Allergic rhinitis due to pollen: Secondary | ICD-10-CM | POA: Diagnosis not present

## 2021-02-08 DIAGNOSIS — H6690 Otitis media, unspecified, unspecified ear: Secondary | ICD-10-CM | POA: Diagnosis not present

## 2021-02-08 MED ORDER — CIPROFLOXACIN-DEXAMETHASONE 0.3-0.1 % OT SUSP: 4.0000 [drp] | Freq: Two times a day (BID) | OTIC | 0 refills | Status: DC

## 2021-02-08 MED ORDER — AZITHROMYCIN 250 MG PO TABS: ORAL_TABLET | ORAL | 0 refills | Status: DC

## 2021-02-08 NOTE — Progress Notes (Signed)
Acute Office Visit  Subjective:    Patient ID: [name], female    DOB: 01-28-1956, 65 y.o.   MRN: [id number]  Chief Complaint  Patient presents with  . Ear Drainage    HPI Patient is in today for evaluation of bilateral ear pain. Started about two weeks ago. She states that she typically gets congested with ear pain every year around this time. Symptoms generally resolve on their own after a week or so. This time, symptoms have only become worse. She states that her ears feel like they are full of water. She states that she does have drainage from both ears. She states that when this first started, she did have some blood tinged drainage. She states that now, drainage is mostly clear. She denies fever, headache, sore throat, or dizziness. She denies nausea or vomiting. She states that she has not taken any medications to relieve the symptoms as of yet.   Past Medical History:  Diagnosis Date  . Abnormal Pap smear of cervix   . Arthritis   . Blood transfusion    1968  . Blood transfusion without reported diagnosis   . Cancer ([place])    cervical  . Complication of anesthesia    sensitive to all meds- takes longer to metabolize  . 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 Hx of BLACK WIDOW SPIDER BITE x 2 left shoulder 10/11 with  "knot at base of scull"   seen by PCP- no neuro visit- resolved  . 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 Hypoglycemia   . Hypothyroidism   . Osteoporosis     Past Surgical History:  Procedure Laterality Date  . CERVICAL CONE BIOPSY    . COLONOSCOPY    . EYE SURGERY     Lasic- left eye  . JOINT REPLACEMENT N/A    [employer]rees[employer] 06/14/2020  . laser conization    . TONSILLECTOMY    . TOTAL HIP ARTHROPLASTY  10/30/2011   Procedure: TOTAL HIP ARTHROPLASTY;  Surgeon: Magnus Sinning, MD;  Location: [facility];  Service: Orthopedics;  Laterality: Left;    Family History  Adopted: Yes    Social History   Socioeconomic History   . Marital status: Married    Spouse name: Not on file  . Number of children: Not on file  . Years of education: Not on file  . Highest education level: Not on file  Occupational History  . Not on file  Tobacco Use  . Smoking status: Former Smoker    Packs/day: 0.50    Years: 38.00    Pack years: 19.00    Types: Cigarettes    Quit date: 03/24/2016    Years since quitting: 4.8  . Smokeless tobacco: Never Used  . Tobacco comment: 5-6 cigarettes day  Substance and Sexual Activity  . Alcohol use: Yes    Comment: occ  . Drug use: No    Comment: marijuana/cocaine use in college  . Sexual activity: Not Currently    Partners: Male  Other Topics Concern  . Not on file  Social History Narrative  . Not on file   Social Determinants of Health   Financial Resource Strain: Not on file  Food Insecurity: Not on file  Transportation Needs: Not on file  Physical Activity: Not on file  Stress: Not on file  Social Connections: Not on file  Intimate Partner Violence: Not on file    Outpatient Medications Prior to  Visit  Medication Sig Dispense Refill  . calcium-vitamin D (OSCAL WITH D) 500-200 MG-UNIT tablet Take 1 tablet by mouth.    . Multiple Vitamin (MULTIVITAMIN PO) Take by mouth.    . Probiotic Product (PROBIOTIC FORMULA PO) Take 1 capsule by mouth daily.    Marland Kitchen. SYNTHROID 100 MCG tablet TAKE 1 TABLET DAILY 90 tablet 1  . Vitamin D, Ergocalciferol, (DRISDOL) 1.25 MG (50000 UNIT) CAPS capsule TAKE 1 CAPSULE BY MOUTH EVERY 7 DAYS 12 capsule 1  . alendronate (FOSAMAX) 70 MG tablet Take 1 tablet (70 mg total) by mouth every 7 (seven) days. Take first thing in am with 6 oz. Water.  Be upright after taking.  Eat nothing for one hour. (Patient not taking: Reported on 01/21/2020) 12 tablet 3   No facility-administered medications prior to visit.    No Known Allergies  Review of Systems  Constitutional: Negative for activity change, chills and fever.  HENT: Positive for ear discharge and  ear pain. Negative for congestion, hearing loss, postnasal drip, rhinorrhea, sinus pressure, sinus pain and tinnitus.   Eyes: Negative.   Respiratory: Negative for cough, shortness of breath and wheezing.   Cardiovascular: Negative for chest pain and palpitations.  Gastrointestinal: Negative for constipation, diarrhea, nausea and vomiting.  Endocrine: Negative.   Musculoskeletal: Negative for back pain and myalgias.  Skin: Negative for rash.  Allergic/Immunologic: Positive for environmental allergies.  Neurological: Negative for dizziness, weakness and headaches.  Hematological: Negative.   Psychiatric/Behavioral: The patient is not nervous/anxious.   All other systems reviewed and are negative.      Objective:    Physical Exam Vitals and nursing note reviewed.  Constitutional:      Appearance: Normal appearance. She is well-developed.  HENT:     Head: Normocephalic and atraumatic.     Right Ear: Drainage and tenderness present. Tympanic membrane is erythematous and bulging.     Left Ear: Tenderness present. Tympanic membrane is erythematous and bulging.     Ears:     Comments: Evidence of clear drainage from left outer ear canal.    Nose:     Right Sinus: No maxillary sinus tenderness or frontal sinus tenderness.     Left Sinus: No maxillary sinus tenderness or frontal sinus tenderness.  Eyes:     Pupils: Pupils are equal, round, and reactive to light.  Cardiovascular:     Rate and Rhythm: Normal rate and regular rhythm.     Pulses: Normal pulses.     Heart sounds: Normal heart sounds.  Pulmonary:     Effort: Pulmonary effort is normal.     Breath sounds: Normal breath sounds.  Abdominal:     Palpations: Abdomen is soft.  Musculoskeletal:        General: Normal range of motion.     Cervical back: Normal range of motion and neck supple.  Lymphadenopathy:     Cervical: No cervical adenopathy.  Skin:    General: Skin is warm and dry.     Capillary Refill: Capillary  refill takes less than 2 seconds.  Neurological:     General: No focal deficit present.     Mental Status: She is alert and oriented to person, place, and time.  Psychiatric:        Mood and Affect: Mood normal.        Behavior: Behavior normal.        Thought Content: Thought content normal.        Judgment: Judgment normal.  Today's Vitals   02/08/21 1143  BP: 115/72  Pulse: 87  Temp: 98.1 F (36.7 C)  SpO2: 93%  Weight: 178 lb 11.2 oz (81.1 kg)  Height: 5\' 8"  (1.727 m)   Body mass index is 27.17 kg/m.   Wt Readings from Last 3 Encounters:  02/08/21 178 lb 11.2 oz (81.1 kg)  06/14/20 183 lb (83 kg)  01/21/20 180 lb (81.6 kg)    Health Maintenance Due  Topic Date Due  . HIV Screening  Never done  . COVID-19 Vaccine (3 - Booster for Pfizer series) 04/29/2020    There are no preventive care reminders to display for this patient.   Lab Results  Component Value Date   TSH 2.470 06/09/2020   Lab Results  Component Value Date   WBC 7.4 06/09/2020   HGB 15.4 06/09/2020   HCT 46.9 (H) 06/09/2020   MCV 91 06/09/2020   PLT 324 06/09/2020   Lab Results  Component Value Date   NA 142 06/09/2020   K 5.0 06/09/2020   CO2 25 06/09/2020   GLUCOSE 99 06/09/2020   BUN 15 06/09/2020   CREATININE 0.94 06/09/2020   BILITOT 0.7 06/09/2020   ALKPHOS 67 06/09/2020   AST 17 06/09/2020   ALT 20 06/09/2020   PROT 7.6 06/09/2020   ALBUMIN 4.6 06/09/2020   CALCIUM 10.3 06/09/2020   Lab Results  Component Value Date   CHOL 210 (H) 06/09/2020   Lab Results  Component Value Date   HDL 48 06/09/2020   Lab Results  Component Value Date   LDLCALC 126 (H) 06/09/2020   Lab Results  Component Value Date   TRIG 201 (H) 06/09/2020   Lab Results  Component Value Date   CHOLHDL 4.4 06/09/2020   Lab Results  Component Value Date   HGBA1C 5.8 (H) 06/09/2020       Assessment & Plan:  1. Acute otitis media, unspecified otitis media type Start z-pack. Take as  directed for 5 days. Add ciprodex ear drops. Use 4 drops in both ears twice daily for next 7 days. Encouraged her to apply warm compress to the ears to help soothe and relieve pain. Recommended tylenol/NSAIDs to reduce pain and inflammation.  - azithromycin (ZITHROMAX) 250 MG tablet; z-pack - take as directed for 5 days  Dispense: 6 tablet; Refill: 0 - ciprofloxacin-dexamethasone (CIPRODEX) OTIC suspension; Place 4 drops into both ears 2 (two) times daily.  Dispense: 7.5 mL; Refill: 0  2. Seasonal allergic rhinitis due to pollen Recommend use [name] OTC claritin during the day. Consider benadryl at night to help allergic symptoms.   Problem List Items Addressed This Visit      Respiratory   Seasonal allergic rhinitis due to pollen     Nervous and Auditory   Acute otitis media - Primary   Relevant Medications   azithromycin (ZITHROMAX) 250 MG tablet   ciprofloxacin-dexamethasone (CIPRODEX) OTIC suspension       Meds ordered this encounter  Medications  . azithromycin (ZITHROMAX) 250 MG tablet    Sig: z-pack - take as directed for 5 days    Dispense:  6 tablet    Refill:  0    Order Specific Question:   Supervising Provider    Answer:   Beatrice Lecher D [[id number]]  . ciprofloxacin-dexamethasone (CIPRODEX) OTIC suspension    Sig: Place 4 drops into both ears 2 (two) times daily.    Dispense:  7.5 mL    Refill:  0    Order  Specific Question:   Supervising Provider    Answer:   Beatrice Lecher D [[id number]]     Ronnell Freshwater, NP

## 2021-02-08 NOTE — Telephone Encounter (Signed)
Did patient have any other symptoms?

## 2021-02-08 NOTE — Telephone Encounter (Signed)
Pt complaining for clear ear drainage. Pt denies any other symptoms. States her ears are very agitated and painful. Pt scheduled to come in today at 1130am. AS, CMA

## 2021-02-08 NOTE — Telephone Encounter (Signed)
Patient has fluid build up in ears and advised patient to take an at home covid test and call back up. Thanks

## 2021-02-08 NOTE — Telephone Encounter (Signed)
She did not, just all ear related. She said she would call back after taking at home covid test.

## 2021-02-08 NOTE — Patient Instructions (Signed)
Otitis Media, Adult  Otitis media is a condition in which the middle ear is red and swollen (inflamed) and full of fluid. The middle ear is the part of the ear that contains bones for hearing as well as air that helps send sounds to the brain. The condition usually goes away on its own. What are the causes? This condition is caused by a blockage in the eustachian tube. The eustachian tube connects the middle ear to the back of the nose. It normally allows air into the middle ear. The blockage is caused by fluid or swelling. Problems that can cause blockage include:  A cold or infection that affects the nose, mouth, or throat.  Allergies.  An irritant, such as tobacco smoke.  Adenoids that have become large. The adenoids are soft tissue located in the back of the throat, behind the nose and the roof of the mouth.  Growth or swelling in the upper part of the throat, just behind the nose (nasopharynx).  Damage to the ear caused by change in pressure. This is called barotrauma. What are the signs or symptoms? Symptoms of this condition include:  Ear pain.  Fever.  Problems with hearing.  Being tired.  Fluid leaking from the ear.  Ringing in the ear. How is this treated? This condition can go away on its own within 3-5 days. But if the condition is caused by bacteria or does not go away on its own, or if it keeps coming back, your doctor may:  Give you antibiotic medicines.  Give you medicines for pain. Follow these instructions at home:  Take over-the-counter and prescription medicines only as told by your doctor.  If you were prescribed an antibiotic medicine, take it as told by your doctor. Do not stop taking the antibiotic even if you start to feel better.  Keep all follow-up visits as told by your doctor. This is important. Contact a doctor if:  You have bleeding from your nose.  There is a lump on your neck.  You are not feeling better in 5 days.  You feel worse  instead of better. Get help right away if:  You have pain that is not helped with medicine.  You have swelling, redness, or pain around your ear.  You get a stiff neck.  You cannot move part of your face (paralysis).  You notice that the bone behind your ear hurts when you touch it.  You get a very bad headache. Summary  Otitis media means that the middle ear is red, swollen, and full of fluid.  This condition usually goes away on its own.  If the problem does not go away, treatment may be needed. You may be given medicines to treat the infection or to treat your pain.  If you were prescribed an antibiotic medicine, take it as told by your doctor. Do not stop taking the antibiotic even if you start to feel better.  Keep all follow-up visits as told by your doctor. This is important. This information is not intended to replace advice given to you by your health care provider. Make sure you discuss any questions you have with your health care provider. Document Revised: 08/13/2019 Document Reviewed: 08/13/2019 Elsevier Patient Education  2021 Elsevier Inc.  

## 2021-03-22 DIAGNOSIS — M5416 Radiculopathy, lumbar region: Secondary | ICD-10-CM | POA: Insufficient documentation

## 2021-04-27 DIAGNOSIS — M7061 Trochanteric bursitis, right hip: Secondary | ICD-10-CM | POA: Insufficient documentation

## 2021-04-27 DIAGNOSIS — M1611 Unilateral primary osteoarthritis, right hip: Secondary | ICD-10-CM | POA: Insufficient documentation

## 2021-06-04 ENCOUNTER — Other Ambulatory Visit: Payer: Self-pay | Admitting: Physician Assistant

## 2021-07-04 ENCOUNTER — Other Ambulatory Visit: Payer: Self-pay | Admitting: Physician Assistant

## 2021-07-14 ENCOUNTER — Other Ambulatory Visit: Payer: Self-pay

## 2021-07-14 DIAGNOSIS — E039 Hypothyroidism, unspecified: Secondary | ICD-10-CM

## 2021-07-14 DIAGNOSIS — Z130 Encounter for screening for diseases of the blood and blood-forming organs and certain disorders involving the immune mechanism: Secondary | ICD-10-CM

## 2021-07-14 DIAGNOSIS — Z13228 Encounter for screening for other metabolic disorders: Secondary | ICD-10-CM

## 2021-07-17 ENCOUNTER — Other Ambulatory Visit: Payer: Self-pay

## 2021-07-17 ENCOUNTER — Other Ambulatory Visit: Payer: Commercial Managed Care - PPO

## 2021-07-17 DIAGNOSIS — Z13228 Encounter for screening for other metabolic disorders: Secondary | ICD-10-CM

## 2021-07-17 DIAGNOSIS — Z130 Encounter for screening for diseases of the blood and blood-forming organs and certain disorders involving the immune mechanism: Secondary | ICD-10-CM

## 2021-07-17 DIAGNOSIS — E039 Hypothyroidism, unspecified: Secondary | ICD-10-CM

## 2021-07-18 ENCOUNTER — Ambulatory Visit (INDEPENDENT_AMBULATORY_CARE_PROVIDER_SITE_OTHER): Payer: Commercial Managed Care - PPO | Admitting: Physician Assistant

## 2021-07-18 ENCOUNTER — Encounter: Payer: Self-pay | Admitting: Physician Assistant

## 2021-07-18 ENCOUNTER — Other Ambulatory Visit: Payer: Self-pay | Admitting: Physician Assistant

## 2021-07-18 VITALS — BP 124/72 | HR 83 | Temp 97.8°F | Ht 68.0 in | Wt 180.6 lb

## 2021-07-18 DIAGNOSIS — L409 Psoriasis, unspecified: Secondary | ICD-10-CM

## 2021-07-18 DIAGNOSIS — E559 Vitamin D deficiency, unspecified: Secondary | ICD-10-CM

## 2021-07-18 DIAGNOSIS — Z1231 Encounter for screening mammogram for malignant neoplasm of breast: Secondary | ICD-10-CM

## 2021-07-18 DIAGNOSIS — E039 Hypothyroidism, unspecified: Secondary | ICD-10-CM

## 2021-07-18 DIAGNOSIS — Z0000 Encounter for general adult medical examination without abnormal findings: Secondary | ICD-10-CM

## 2021-07-18 DIAGNOSIS — E782 Mixed hyperlipidemia: Secondary | ICD-10-CM

## 2021-07-18 LAB — COMPREHENSIVE METABOLIC PANEL
ALT: 19 IU/L (ref 0–32)
AST: 20 IU/L (ref 0–40)
Albumin/Globulin Ratio: 1.7 (ref 1.2–2.2)
Albumin: 4.6 g/dL (ref 3.8–4.8)
Alkaline Phosphatase: 58 IU/L (ref 44–121)
BUN/Creatinine Ratio: 12 (ref 12–28)
BUN: 11 mg/dL (ref 8–27)
Bilirubin Total: 0.7 mg/dL (ref 0.0–1.2)
CO2: 22 mmol/L (ref 20–29)
Calcium: 10 mg/dL (ref 8.7–10.3)
Chloride: 102 mmol/L (ref 96–106)
Creatinine, Ser: 0.93 mg/dL (ref 0.57–1.00)
Globulin, Total: 2.7 g/dL (ref 1.5–4.5)
Glucose: 91 mg/dL (ref 70–99)
Potassium: 4.6 mmol/L (ref 3.5–5.2)
Sodium: 139 mmol/L (ref 134–144)
Total Protein: 7.3 g/dL (ref 6.0–8.5)
eGFR: 69 mL/min/{1.73_m2} (ref >59)

## 2021-07-18 LAB — LIPID PANEL
Chol/HDL Ratio: 4.1 ratio (ref 0.0–4.4)
Cholesterol, Total: 181 mg/dL (ref 100–199)
HDL: 44 mg/dL (ref >39)
LDL Chol Calc (NIH): 110 mg/dL — ABNORMAL HIGH (ref 0–99)
Triglycerides: 154 mg/dL — ABNORMAL HIGH (ref 0–149)
VLDL Cholesterol Cal: 27 mg/dL (ref 5–40)

## 2021-07-18 LAB — CBC WITH DIFFERENTIAL/PLATELET
Basophils Absolute: 0.1 10*3/uL (ref 0.0–0.2)
Basos: 1 %
EOS (ABSOLUTE): 0.1 10*3/uL (ref 0.0–0.4)
Eos: 2 %
Hematocrit: 44.9 % (ref 34.0–46.6)
Hemoglobin: 15.3 g/dL (ref 11.1–15.9)
Immature Grans (Abs): 0 10*3/uL (ref 0.0–0.1)
Immature Granulocytes: 0 %
Lymphocytes Absolute: 2.7 10*3/uL (ref 0.7–3.1)
Lymphs: 43 %
MCH: 30.5 pg (ref 26.6–33.0)
MCHC: 34.1 g/dL (ref 31.5–35.7)
MCV: 90 fL (ref 79–97)
Monocytes Absolute: 0.5 10*3/uL (ref 0.1–0.9)
Monocytes: 8 %
Neutrophils Absolute: 2.9 10*3/uL (ref 1.4–7.0)
Neutrophils: 46 %
Platelets: 377 10*3/uL (ref 150–450)
RBC: 5.01 x10E6/uL (ref 3.77–5.28)
RDW: 12.2 % (ref 11.7–15.4)
WBC: 6.4 10*3/uL (ref 3.4–10.8)

## 2021-07-18 LAB — HEMOGLOBIN A1C
Est. average glucose Bld gHb Est-mCnc: 120 mg/dL
Hgb A1c MFr Bld: 5.8 % — ABNORMAL HIGH (ref 4.8–5.6)

## 2021-07-18 LAB — T3: T3, Total: 102 ng/dL (ref 71–180)

## 2021-07-18 LAB — TSH: TSH: 3.24 u[IU]/mL (ref 0.450–4.500)

## 2021-07-18 LAB — T4, FREE: Free T4: 1.49 ng/dL (ref 0.82–1.77)

## 2021-07-18 MED ORDER — CLOBETASOL PROPIONATE 0.05 % EX SOLN: 1.0000 "application " | Freq: Two times a day (BID) | CUTANEOUS | 0 refills | Status: DC | PRN

## 2021-07-18 NOTE — Patient Instructions (Signed)
Preventive Care 40-64 Years Old, Female Preventive care refers to lifestyle choices and visits with your health care provider that can promote health and wellness. This includes: A yearly physical exam. This is also called an annual wellness visit. Regular dental and eye exams. Immunizations. Screening for certain conditions. Healthy lifestyle choices, such as: Eating a healthy diet. Getting regular exercise. Not using drugs or products that contain nicotine and tobacco. Limiting alcohol use. What can I expect for my preventive care visit? Physical exam Your health care provider will check your: Height and weight. These may be used to calculate your BMI (body mass index). BMI is a measurement that tells if you are at a healthy weight. Heart rate and blood pressure. Body temperature. Skin for abnormal spots. Counseling Your health care provider may ask you questions about your: Past medical problems. Family's medical history. Alcohol, tobacco, and drug use. Emotional well-being. Home life and relationship well-being. Sexual activity. Diet, exercise, and sleep habits. Work and work environment. Access to firearms. Method of birth control. Menstrual cycle. Pregnancy history. What immunizations do I need? Vaccines are usually given at various ages, according to a schedule. Your health care provider will recommend vaccines for you based on your age, medical history, and lifestyle or other factors, such as travel or where you work. What tests do I need? Blood tests Lipid and cholesterol levels. These may be checked every 5 years, or more often if you are over 50 years old. Hepatitis C test. Hepatitis B test. Screening Lung cancer screening. You may have this screening every year starting at age 55 if you have a 30-pack-year history of smoking and currently smoke or have quit within the past 15 years. Colorectal cancer screening. All adults should have this screening starting at  age 50 and continuing until age 75. Your health care provider may recommend screening at age 45 if you are at increased risk. You will have tests every 1-10 years, depending on your results and the type of screening test. Diabetes screening. This is done by checking your blood sugar (glucose) after you have not eaten for a while (fasting). You may have this done every 1-3 years. Mammogram. This may be done every 1-2 years. Talk with your health care provider about when you should start having regular mammograms. This may depend on whether you have a family history of breast cancer. BRCA-related cancer screening. This may be done if you have a family history of breast, ovarian, tubal, or peritoneal cancers. Pelvic exam and Pap test. This may be done every 3 years starting at age 21. Starting at age 30, this may be done every 5 years if you have a Pap test in combination with an HPV test. Other tests STD (sexually transmitted disease) testing, if you are at risk. Bone density scan. This is done to screen for osteoporosis. You may have this scan if you are at high risk for osteoporosis. Talk with your health care provider about your test results, treatment options, and if necessary, the need for more tests. Follow these instructions at home: Eating and drinking  Eat a diet that includes fresh fruits and vegetables, whole grains, lean protein, and low-fat dairy products. Take vitamin and mineral supplements as recommended by your health care provider. Do not drink alcohol if: Your health care provider tells you not to drink. You are pregnant, may be pregnant, or are planning to become pregnant. If you drink alcohol: Limit how much you have to 0-1 drink a day. Be   aware of how much alcohol is in your drink. In the [place] one drink equals one 12 oz bottle of beer (355 mL), one 5 oz glass of wine (148 mL), or one 1 oz glass of hard liquor (44 mL). Lifestyle Take daily care of your teeth and  gums. Brush your teeth every morning and night with fluoride toothpaste. Floss one time each day. Stay active. Exercise for at least 30 minutes 5 or more days each week. Do not use any products that contain nicotine or tobacco, such as cigarettes, e-cigarettes, and chewing tobacco. If you need help quitting, ask your health care provider. Do not use drugs. If you are sexually active, practice safe sex. Use a condom or other form of protection to prevent STIs (sexually transmitted infections). If you do not wish to become pregnant, use a form of birth control. If you plan to become pregnant, see your health care provider for a prepregnancy visit. If told by your health care provider, take low-dose aspirin daily starting at age 63. Find healthy ways to cope with stress, such as: Meditation, yoga, or listening to music. Journaling. Talking to a trusted person. Spending time with friends and family. Safety Always wear your seat belt while driving or riding in a vehicle. Do not drive: If you have been drinking alcohol. Do not ride with someone who has been drinking. When you are tired or distracted. While texting. Wear a helmet and other protective equipment during sports activities. If you have firearms in your house, make sure you follow all gun safety procedures. What's next? Visit your health care provider once a year for an annual wellness visit. Ask your health care provider how often you should have your eyes and teeth checked. Stay up to date on all vaccines. This information is not intended to replace advice given to you by your health care provider. Make sure you discuss any questions you have with your health care provider. Document Revised: 11/18/2020 Document Reviewed: 05/22/2018 Elsevier Patient Education  2022 [employer].

## 2021-07-18 NOTE — Progress Notes (Signed)
Subjective:     [name] is a 65 y.o. female and is here for a comprehensive physical exam. The patient reports no problems.   Social History   Socioeconomic History   Marital status: Married    Spouse name: Not on file   Number of children: Not on file   Years of education: Not on file   Highest education level: Not on file  Occupational History   Not on file  Tobacco Use   Smoking status: Former    Packs/day: 0.50    Years: 38.00    Pack years: 19.00    Types: Cigarettes    Quit date: 03/24/2016    Years since quitting: 5.3   Smokeless tobacco: Never   Tobacco comments:    5-6 cigarettes day  Substance and Sexual Activity   Alcohol use: Yes    Comment: occ   Drug use: No    Comment: marijuana/cocaine use in college   Sexual activity: Not Currently    Partners: Male  Other Topics Concern   Not on file  Social History Narrative   Not on file   Social Determinants of Health   Financial Resource Strain: Not on file  Food Insecurity: Not on file  Transportation Needs: Not on file  Physical Activity: Not on file  Stress: Not on file  Social Connections: Not on file  Intimate Partner Violence: Not on file   Health Maintenance  Topic Date Due   HIV Screening  Never done   Zoster Vaccines- Shingrix (1 of 2) Never done   PAP SMEAR-Modifier  09/09/2021   MAMMOGRAM  07/13/2022   TETANUS/TDAP  01/08/2027   COLONOSCOPY (Pts 45-43yrs Insurance coverage will need to be confirmed)  10/25/2029   INFLUENZA VACCINE  Completed   COVID-19 Vaccine  Completed   Hepatitis C Screening  Completed   Pneumococcal Vaccine 12-40 Years old  Aged Out   HPV VACCINES  Aged Out    The following portions of the patient's history were reviewed and updated as appropriate: allergies, current medications, past family history, past medical history, past social history, past surgical history, and problem list.  Review of Systems Pertinent items noted in HPI and remainder of  comprehensive ROS otherwise negative.   Objective:    BP 124/72   Pulse 83   Temp 97.8 F (36.6 C)   Ht 5\' 8"  (1.727 m)   Wt 180 lb 9.6 oz (81.9 kg)   LMP 10/29/2000   SpO2 94%   BMI 27.46 kg/m  General appearance: alert, cooperative, and no distress Head: Normocephalic, without obvious abnormality, atraumatic Eyes: conjunctivae/corneas clear. PERRL, EOM's intact. Fundi benign. Ears: abnormal external canal right ear - scaly lesions and abnormal external canal left ear - scaly lesions; normal TM's of both ears Nose: Nares normal. Septum midline. Mucosa normal. No drainage or sinus tenderness. Throat: lips, mucosa, and tongue normal; teeth and gums normal Neck: no adenopathy, no JVD, supple, symmetrical, trachea midline, and thyroid: normal to inspection and palpation Back: symmetric, no curvature. ROM normal. No CVA tenderness. Lungs: clear to auscultation bilaterally Heart: regular rate and rhythm and S1, S2 normal Abdomen: soft, non-tender; bowel sounds normal; no masses,  no organomegaly Extremities: extremities normal, atraumatic, no cyanosis or edema Pulses: 2+ and symmetric Skin: mobility and turgor normal and no edema or varicosities - lower leg(s) bilateral (spider veins) Lymph nodes: Cervical adenopathy: normal and Supraclavicular adenopathy: normal Neurologic: Grossly normal    Assessment:    Healthy female exam.  Plan:  -Discussed most recent lab results. Lipid panel has mildly improved and A1c remains in prediabetes. Discussed to continue with dietary changes including reduction of saturated fats and continue to monitor carbohydrates. Recommend to gradually increase physical activity with ultimate goal of 150 minutes/wk. -The 10-year ASCVD risk score (Arnett DK, et al., 2019) is: 4.9% -Continue to stay well hydrated. 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 Provided new rx. Continue current medication regimen. -Declined Shingrix and HIV screening.  Obtained influenza vaccine at [facility], will verify [facility]. -Scheduled for mammogram 07/22/2021. UTD colonoscopy, Tdap. -Plans to schedule female exam and bone density with OB/GYN, advised if unable to get bone density order to let me know. -Follow up in 1 year for CPE and FBW or sooner if needed.  See After Visit Summary for Counseling Recommendations

## 2021-07-20 ENCOUNTER — Other Ambulatory Visit: Payer: Self-pay | Admitting: Physician Assistant

## 2021-07-20 DIAGNOSIS — E2839 Other primary ovarian failure: Secondary | ICD-10-CM

## 2021-07-20 DIAGNOSIS — Z780 Asymptomatic menopausal state: Secondary | ICD-10-CM

## 2021-07-22 ENCOUNTER — Ambulatory Visit
Admission: RE | Admit: 2021-07-22 | Discharge: 2021-07-22 | Disposition: A | Discharge status: Home / Self Care | Payer: Commercial Managed Care - PPO | Source: Ambulatory Visit

## 2021-07-22 DIAGNOSIS — Z1231 Encounter for screening mammogram for malignant neoplasm of breast: Secondary | ICD-10-CM

## 2021-07-25 ENCOUNTER — Other Ambulatory Visit: Payer: Self-pay | Admitting: Physician Assistant

## 2021-07-25 DIAGNOSIS — R928 Other abnormal and inconclusive findings on diagnostic imaging of breast: Secondary | ICD-10-CM

## 2021-08-12 ENCOUNTER — Ambulatory Visit
Admission: RE | Admit: 2021-08-12 | Discharge: 2021-08-12 | Disposition: A | Discharge status: Home / Self Care | Payer: Commercial Managed Care - PPO | Source: Ambulatory Visit | Attending: Physician Assistant | Admitting: Physician Assistant

## 2021-08-12 ENCOUNTER — Ambulatory Visit: Payer: Commercial Managed Care - PPO

## 2021-08-12 ENCOUNTER — Other Ambulatory Visit: Payer: Commercial Managed Care - PPO

## 2021-08-12 ENCOUNTER — Other Ambulatory Visit: Payer: Self-pay

## 2021-08-12 DIAGNOSIS — R928 Other abnormal and inconclusive findings on diagnostic imaging of breast: Secondary | ICD-10-CM

## 2021-08-16 ENCOUNTER — Other Ambulatory Visit: Payer: Commercial Managed Care - PPO

## 2021-08-21 LAB — HM DEXA SCAN

## 2021-08-28 ENCOUNTER — Encounter: Payer: Self-pay | Admitting: Physician Assistant

## 2021-09-16 ENCOUNTER — Other Ambulatory Visit: Payer: Self-pay | Admitting: Physician Assistant

## 2021-09-20 ENCOUNTER — Encounter: Payer: Self-pay | Admitting: Physician Assistant

## 2021-09-21 ENCOUNTER — Other Ambulatory Visit: Payer: Self-pay

## 2021-09-21 ENCOUNTER — Ambulatory Visit: Payer: Commercial Managed Care - PPO | Admitting: Family Medicine

## 2021-09-21 ENCOUNTER — Encounter: Payer: Self-pay | Admitting: Family Medicine

## 2021-09-21 ENCOUNTER — Other Ambulatory Visit (HOSPITAL_COMMUNITY)
Admission: RE | Admit: 2021-09-21 | Discharge: 2021-09-21 | Disposition: A | Discharge status: Home / Self Care | Payer: Commercial Managed Care - PPO | Source: Ambulatory Visit | Attending: Family Medicine | Admitting: Family Medicine

## 2021-09-21 VITALS — BP 131/80 | HR 81 | Ht 68.0 in | Wt 178.0 lb

## 2021-09-21 DIAGNOSIS — Z01419 Encounter for gynecological examination (general) (routine) without abnormal findings: Secondary | ICD-10-CM | POA: Diagnosis not present

## 2021-09-21 DIAGNOSIS — Z780 Asymptomatic menopausal state: Secondary | ICD-10-CM | POA: Diagnosis not present

## 2021-09-21 DIAGNOSIS — Z124 Encounter for screening for malignant neoplasm of cervix: Secondary | ICD-10-CM

## 2021-09-21 DIAGNOSIS — M8580 Other specified disorders of bone density and structure, unspecified site: Secondary | ICD-10-CM | POA: Insufficient documentation

## 2021-09-21 NOTE — Assessment & Plan Note (Signed)
Frax risk is 15% and worst T score is -2.3. On Fosamax in the past but was not able to tolerate this. On Calcium + vitamin D. Care per primary care.

## 2021-09-21 NOTE — Progress Notes (Signed)
Subjective:     [name] is a 65 y.o. female and is here for a comprehensive physical exam. The patient reports no problems.   The following portions of the patient's history were reviewed and updated as appropriate: allergies, current medications, past family history, past medical history, past social history, past surgical history, and problem list.  Review of Systems Pertinent items noted in HPI and remainder of comprehensive ROS otherwise negative.   Objective:    BP 131/80    Pulse 81    Ht 5\' 8"  (1.727 m)    Wt 178 lb (80.7 kg)    LMP 10/29/2000    BMI 27.06 kg/m  General appearance: alert, cooperative, and appears stated age Head: Normocephalic, without obvious abnormality, atraumatic Neck: no adenopathy, supple, symmetrical, trachea midline, and thyroid not enlarged, symmetric, no tenderness/mass/nodules Lungs: clear to auscultation bilaterally Breasts: normal appearance, no masses or tenderness Heart: regular rate and rhythm, S1, S2 normal, no murmur, click, rub or gallop Abdomen: soft, non-tender; bowel sounds normal; no masses,  no organomegaly Pelvic: cervix normal in appearance, external genitalia normal, no adnexal masses or tenderness, no cervical motion tenderness, uterus normal size, shape, and consistency, and vagina normal without discharge Extremities: extremities normal, atraumatic, no cyanosis or edema Pulses: 2+ and symmetric Skin: Skin color, texture, turgor normal. No rashes or lesions Lymph nodes: Cervical, supraclavicular, and axillary nodes normal. Neurologic: Grossly normal    Assessment:   GYN female exam.      Plan:  Screening for malignant neoplasm of cervix - Should not need further pap smears, if this is normal. - Plan: Cytology - PAP( [place])  Encounter for gynecological examination without abnormal finding - Normal mammogram earlier this year.  Osteopenia after menopause  Return in 1 year (on 09/21/2022).    See After Visit  Summary for Counseling Recommendations

## 2021-09-22 LAB — CYTOLOGY - PAP
Comment: NEGATIVE
Diagnosis: NEGATIVE
High risk HPV: NEGATIVE

## 2021-11-14 ENCOUNTER — Other Ambulatory Visit: Payer: Self-pay

## 2021-11-14 ENCOUNTER — Ambulatory Visit: Payer: Medicare Other | Admitting: Podiatry

## 2021-11-14 ENCOUNTER — Encounter: Payer: Self-pay | Admitting: Podiatry

## 2021-11-14 DIAGNOSIS — L84 Corns and callosities: Secondary | ICD-10-CM | POA: Diagnosis not present

## 2021-11-14 DIAGNOSIS — M216X2 Other acquired deformities of left foot: Secondary | ICD-10-CM | POA: Diagnosis not present
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
## 2021-12-07 ENCOUNTER — Other Ambulatory Visit: Payer: Self-pay

## 2021-12-07 ENCOUNTER — Ambulatory Visit (INDEPENDENT_AMBULATORY_CARE_PROVIDER_SITE_OTHER): Payer: Medicare Other | Admitting: Physician Assistant

## 2021-12-07 ENCOUNTER — Encounter: Payer: Self-pay | Admitting: Physician Assistant

## 2021-12-07 VITALS — BP 131/77 | HR 84 | Temp 97.7°F | Ht 69.0 in | Wt 181.0 lb

## 2021-12-07 DIAGNOSIS — Z01818 Encounter for other preprocedural examination: Secondary | ICD-10-CM | POA: Diagnosis not present

## 2021-12-07 DIAGNOSIS — E782 Mixed hyperlipidemia: Secondary | ICD-10-CM | POA: Diagnosis not present

## 2021-12-07 DIAGNOSIS — E039 Hypothyroidism, unspecified: Secondary | ICD-10-CM | POA: Diagnosis not present

## 2021-12-07 DIAGNOSIS — R7302 Impaired glucose tolerance (oral): Secondary | ICD-10-CM | POA: Diagnosis not present

## 2021-12-07 MED ORDER — SYNTHROID 100 MCG PO TABS: 100.0000 ug | ORAL_TABLET | Freq: Every day | ORAL | 1 refills | Status: DC

## 2021-12-08 LAB — COMPREHENSIVE METABOLIC PANEL
ALT: 15 IU/L (ref 0–32)
AST: 16 IU/L (ref 0–40)
Albumin/Globulin Ratio: 1.9 (ref 1.2–2.2)
Albumin: 5 g/dL — ABNORMAL HIGH (ref 3.8–4.8)
Alkaline Phosphatase: 63 IU/L (ref 44–121)
BUN/Creatinine Ratio: 19 (ref 12–28)
BUN: 16 mg/dL (ref 8–27)
Bilirubin Total: 0.8 mg/dL (ref 0.0–1.2)
CO2: 25 mmol/L (ref 20–29)
Calcium: 10 mg/dL (ref 8.7–10.3)
Chloride: 101 mmol/L (ref 96–106)
Creatinine, Ser: 0.86 mg/dL (ref 0.57–1.00)
Globulin, Total: 2.7 g/dL (ref 1.5–4.5)
Glucose: 78 mg/dL (ref 70–99)
Potassium: 4.3 mmol/L (ref 3.5–5.2)
Sodium: 140 mmol/L (ref 134–144)
Total Protein: 7.7 g/dL (ref 6.0–8.5)
eGFR: 75 mL/min/{1.73_m2} (ref >59)

## 2021-12-08 LAB — HEMOGLOBIN A1C
Est. average glucose Bld gHb Est-mCnc: 123 mg/dL
Hgb A1c MFr Bld: 5.9 % — ABNORMAL HIGH (ref 4.8–5.6)

## 2021-12-08 LAB — CBC WITH DIFFERENTIAL/PLATELET
Basophils Absolute: 0.1 10*3/uL (ref 0.0–0.2)
Basos: 1 %
EOS (ABSOLUTE): 0.2 10*3/uL (ref 0.0–0.4)
Eos: 3 %
Hematocrit: 46.6 % (ref 34.0–46.6)
Hemoglobin: 16.1 g/dL — ABNORMAL HIGH (ref 11.1–15.9)
Immature Grans (Abs): 0 10*3/uL (ref 0.0–0.1)
Immature Granulocytes: 0 %
Lymphocytes Absolute: 2 10*3/uL (ref 0.7–3.1)
Lymphs: 33 %
MCH: 30.7 pg (ref 26.6–33.0)
MCHC: 34.5 g/dL (ref 31.5–35.7)
MCV: 89 fL (ref 79–97)
Monocytes Absolute: 0.6 10*3/uL (ref 0.1–0.9)
Monocytes: 10 %
Neutrophils Absolute: 3.3 10*3/uL (ref 1.4–7.0)
Neutrophils: 53 %
Platelets: 346 10*3/uL (ref 150–450)
RBC: 5.24 x10E6/uL (ref 3.77–5.28)
RDW: 12.7 % (ref 11.7–15.4)
WBC: 6.1 10*3/uL (ref 3.4–10.8)

## 2021-12-08 LAB — PROTIME-INR
INR: 1.1 (ref 0.9–1.2)
Prothrombin Time: 11.1 s (ref 9.1–12.0)

## 2021-12-08 LAB — T3: T3, Total: 119 ng/dL (ref 71–180)

## 2021-12-08 LAB — T4, FREE: Free T4: 1.64 ng/dL (ref 0.82–1.77)

## 2021-12-08 LAB — TSH: TSH: 2.38 u[IU]/mL (ref 0.450–4.500)

## 2021-12-11 NOTE — Addendum Note (Signed)
Addended by: Aron Baba on: 12/11/2021 11:28 AM ? ? Modules accepted: Orders ? ?

## 2021-12-12 ENCOUNTER — Other Ambulatory Visit: Payer: Self-pay

## 2021-12-27 ENCOUNTER — Other Ambulatory Visit: Payer: Commercial Managed Care - PPO

## 2022-07-03 ENCOUNTER — Other Ambulatory Visit: Payer: Self-pay | Admitting: Physician Assistant

## 2022-07-03 DIAGNOSIS — E039 Hypothyroidism, unspecified: Secondary | ICD-10-CM

## 2022-07-04 ENCOUNTER — Encounter: Payer: Self-pay | Admitting: Physician Assistant

## 2022-07-04 ENCOUNTER — Ambulatory Visit (INDEPENDENT_AMBULATORY_CARE_PROVIDER_SITE_OTHER): Payer: Medicare Other | Admitting: Physician Assistant

## 2022-07-04 VITALS — BP 130/82 | HR 92 | Temp 98.0°F | Ht 69.0 in | Wt 180.0 lb

## 2022-07-04 DIAGNOSIS — H60393 Other infective otitis externa, bilateral: Secondary | ICD-10-CM

## 2022-07-04 DIAGNOSIS — H6693 Otitis media, unspecified, bilateral: Secondary | ICD-10-CM | POA: Diagnosis not present

## 2022-07-04 MED ORDER — AMOXICILLIN-POT CLAVULANATE 875-125 MG PO TABS: 1.0000 | ORAL_TABLET | Freq: Two times a day (BID) | ORAL | 0 refills | Status: DC

## 2022-07-04 MED ORDER — CIPROFLOXACIN-DEXAMETHASONE 0.3-0.1 % OT SUSP: 4.0000 [drp] | Freq: Two times a day (BID) | OTIC | 0 refills | Status: DC

## 2022-07-04 MED ORDER — CEFTRIAXONE SODIUM 1 G IJ SOLR
1.0000 g | Freq: Once | INTRAMUSCULAR | Status: AC
  Administered 2022-07-04: 1 g via INTRAMUSCULAR

## 2022-07-04 NOTE — Progress Notes (Signed)
Established patient acute visit   Patient: [name]   DOB: 1956/09/19   66 y.o. Female  MRN: [id number] Visit Date: 07/04/2022  Chief Complaint  Patient presents with   Acute Visit    Ear infection   Subjective    HPI HPI     Acute Visit    Additional comments: Ear infection      Last edited by Adelfa Koh, CMA on 07/04/2022  2:03 PM.      Patient presents with c/o bilateral ear fullness and has noticed clear-white drainage from both ears. Having a runny nose so thought it was allergies. States ear fullness started about 1 month ago and has worsened the past 2 weeks. Initially it was just the left ear but now also the right. Patient does have a pool and swims frequently. Also has sinus pressure and congestion. No fever, cough or chills.    Medications: Outpatient Medications Prior to Visit  Medication Sig   cholecalciferol (VITAMIN D3) 25 MCG (1000 UNIT) tablet Take 1,000 Units by mouth daily.   fluocinonide (LIDEX) 0.05 % external solution Apply 4.56 application topically 2 (two) times daily as needed.   Glucosamine-Chondroit-Vit C-Mn (GLUCOSAMINE 1500 COMPLEX PO) Take by mouth.   ketoconazole (NIZORAL) 2 % shampoo ketoconazole 2 % shampoo  LATHER ON SCALP AND EARS AND LEAVE ON FOR 5 TO 10 MINUTES, RINSE WELL. USE 1 TO 2 TIMES A WEEK.   magnesium 30 MG tablet Take 30 mg by mouth daily.   Probiotic Product (PROBIOTIC FORMULA PO) Take 1 capsule by mouth daily.   SYNTHROID 100 MCG tablet TAKE 1 TABLET BY MOUTH ONCE DAILY BEFORE BREAKFAST   calcium carbonate (OS-CAL - DOSED IN MG OF ELEMENTAL CALCIUM) 1250 (500 Ca) MG tablet Take 1 tablet by mouth. (Patient not taking: Reported on 07/04/2022)   No facility-administered medications prior to visit.    Review of Systems Review of Systems:  A fourteen system review of systems was performed and found to be positive as per HPI.     Objective    BP 130/82   Pulse 92   Temp 98 F (36.7 C) (Temporal)   Ht  '5\' 9"'$  (1.753 m)   Wt 180 lb (81.6 kg)   LMP 10/29/2000   SpO2 95%   BMI 26.58 kg/m    Physical Exam  General:  Cooperative, in no acute distress, appropriate for stated age.  Neuro:  Alert and oriented,  extra-ocular muscles intact  HEENT:  Normocephalic, atraumatic, neck supple, bilateral redness and swelling of both ear canals with cloudy TM's, positive Tragus and Pinna signs bilaterally, boggy turbinates, +anterior and posterior cervical adenopathy  Skin:  no gross rash, warm, pink. Cardiac:  RRR, S1 S2 Respiratory: CTA B/L w/o wheezing, crackles or rales. Vascular:  Ext warm, no cyanosis apprec.; cap RF less 2 sec. Psych:  No HI/SI, judgement and insight good, Euthymic mood. Full Affect.   No results found for any visits on 07/04/22.  Assessment & Plan     Patient presents with significant bilateral otitis media and externa so will administer ceftriaxone 1 g in office. Will start Augmentin 875-125 mg BID x 10 days and Cipro-Dex 4 drops into both ears x 7 days. Discussed to avoid swimming until symptoms improve and resolve and discussed preventative measures before swimming.    Return if symptoms worsen or fail to improve.        Lorrene Reid, PA-C  [facility] at [facility] [phone] (phone) [phone] (  fax)  [place]

## 2022-07-04 NOTE — Patient Instructions (Signed)
Otitis Media, Adult  Otitis media is a condition in which the middle ear is red and swollen (inflamed) and full of fluid. The middle ear is the part of the ear that contains bones for hearing as well as air that helps send sounds to the brain. The condition usually goes away on its own. What are the causes? This condition is caused by a blockage in the eustachian tube. This tube connects the middle ear to the back of the nose. It normally allows air into the middle ear. The blockage is caused by fluid or swelling. Problems that can cause blockage include: A cold or infection that affects the nose, mouth, or throat. Allergies. An irritant, such as tobacco smoke. Adenoids that have become large. The adenoids are soft tissue located in the back of the throat, behind the nose and the roof of the mouth. Growth or swelling in the upper part of the throat, just behind the nose (nasopharynx). Damage to the ear caused by a change in pressure. This is called barotrauma. What increases the risk? You are more likely to develop this condition if you: Smoke or are exposed to tobacco smoke. Have an opening in the roof of your mouth (cleft palate). Have acid reflux. Have problems in your body's defense system (immune system). What are the signs or symptoms? Symptoms of this condition include: Ear pain. Fever. Problems with hearing. Being tired. Fluid leaking from the ear. Ringing in the ear. How is this treated? This condition can go away on its own within 3-5 days. But if the condition is caused by germs (bacteria) and does not go away on its own, or if it keeps coming back, your doctor may: Give you antibiotic medicines. Give you medicines for pain. Follow these instructions at home: Take over-the-counter and prescription medicines only as told by your doctor. If you were prescribed an antibiotic medicine, take it as told by your doctor. Do not stop taking it even if you start to feel better. Keep  all follow-up visits. Contact a doctor if: You have bleeding from your nose. There is a lump on your neck. You are not feeling better in 5 days. You feel worse instead of better. Get help right away if: You have pain that is not helped with medicine. You have swelling, redness, or pain around your ear. You get a stiff neck. You cannot move part of your face (paralysis). You notice that the bone behind your ear hurts when you touch it. You get a very bad headache. Summary Otitis media means that the middle ear is red, swollen, and full of fluid. This condition usually goes away on its own. If the problem does not go away, treatment may be needed. You may be given medicines to treat the infection or to treat your pain. If you were prescribed an antibiotic medicine, take it as told by your doctor. Do not stop taking it even if you start to feel better. Keep all follow-up visits. This information is not intended to replace advice given to you by your health care provider. Make sure you discuss any questions you have with your health care provider. Document Revised: 12/19/2020 Document Reviewed: 12/19/2020 Elsevier Patient Education  2023 Elsevier Inc.  

## 2022-07-06 ENCOUNTER — Telehealth: Payer: Self-pay

## 2022-07-06 ENCOUNTER — Encounter: Payer: Self-pay | Admitting: Physician Assistant

## 2022-07-06 ENCOUNTER — Ambulatory Visit (INDEPENDENT_AMBULATORY_CARE_PROVIDER_SITE_OTHER): Payer: Medicare Other | Admitting: Physician Assistant

## 2022-07-06 VITALS — BP 132/75 | HR 69 | Ht 69.0 in | Wt 180.0 lb

## 2022-07-06 DIAGNOSIS — H6693 Otitis media, unspecified, bilateral: Secondary | ICD-10-CM

## 2022-07-06 MED ORDER — CEFTRIAXONE SODIUM 1 G IJ SOLR
1.0000 g | Freq: Once | INTRAMUSCULAR | Status: AC
  Administered 2022-07-06: 1 g via INTRAMUSCULAR

## 2022-07-06 NOTE — Progress Notes (Signed)
Pt tolerated the injection well with no complication

## 2022-07-06 NOTE — Telephone Encounter (Signed)
Pt was seen on Wednesday 07/04/22.  Pt was advised to call the office at 48 hrs if she wasn't feeling better for another injection.  Pt is calling stating that she isn't feeling better and was wondering if she could come to get injection today before 12.  Please advise

## 2022-07-19 ENCOUNTER — Encounter: Payer: Self-pay | Admitting: Physician Assistant

## 2022-07-19 ENCOUNTER — Ambulatory Visit (INDEPENDENT_AMBULATORY_CARE_PROVIDER_SITE_OTHER): Payer: Medicare Other | Admitting: Physician Assistant

## 2022-07-19 VITALS — BP 108/69 | HR 75 | Ht 69.0 in | Wt 179.1 lb

## 2022-07-19 DIAGNOSIS — H6993 Unspecified Eustachian tube disorder, bilateral: Secondary | ICD-10-CM | POA: Diagnosis not present

## 2022-07-19 DIAGNOSIS — H60543 Acute eczematoid otitis externa, bilateral: Secondary | ICD-10-CM

## 2022-07-19 MED ORDER — PREDNISONE 20 MG PO TABS
20.0000 mg | ORAL_TABLET | Freq: Every day | ORAL | 0 refills | Status: AC
Start: 2022-07-19 — End: 2022-07-22

## 2022-07-19 MED ORDER — DESONIDE 0.05 % EX CREA: TOPICAL_CREAM | Freq: Two times a day (BID) | CUTANEOUS | 0 refills | Status: DC

## 2022-07-19 NOTE — Progress Notes (Signed)
Established patient visit   Patient: [name]   DOB: Aug 06, 1956   66 y.o. Female  MRN: [id number] Visit Date: 07/19/2022  Chief Complaint  Patient presents with   Ear Fullness   Subjective    HPI  Patient presents for follow-up on bilateral ear infection. Patient was treated with Augmentin 875/125 mg x 10 days and otic Cipro-Dex. Patient reports feeling better but continues to have nasal/head congestion and tenderness over tragus area of both ears. Ears do not feel as full as before. Also reports dry and itchy skin of earlobe and tragus. In the past has seen a dermatologist for this rash and was given a rx for fluticasone cream which did not help much. No new symptoms.     Medications: Outpatient Medications Prior to Visit  Medication Sig   amoxicillin-clavulanate (AUGMENTIN) 875-125 MG tablet Take 1 tablet by mouth 2 (two) times daily.   cholecalciferol (VITAMIN D3) 25 MCG (1000 UNIT) tablet Take 1,000 Units by mouth daily.   ciprofloxacin-dexamethasone (CIPRODEX) OTIC suspension Place 4 drops into both ears 2 (two) times daily.   fluocinonide (LIDEX) 0.05 % external solution Apply 7.01 application topically 2 (two) times daily as needed.   Glucosamine-Chondroit-Vit C-Mn (GLUCOSAMINE 1500 COMPLEX PO) Take by mouth.   ketoconazole (NIZORAL) 2 % shampoo ketoconazole 2 % shampoo  LATHER ON SCALP AND EARS AND LEAVE ON FOR 5 TO 10 MINUTES, RINSE WELL. USE 1 TO 2 TIMES A WEEK.   magnesium 30 MG tablet Take 30 mg by mouth daily.   Probiotic Product (PROBIOTIC FORMULA PO) Take 1 capsule by mouth daily.   SYNTHROID 100 MCG tablet TAKE 1 TABLET BY MOUTH ONCE DAILY BEFORE BREAKFAST   calcium carbonate (OS-CAL - DOSED IN MG OF ELEMENTAL CALCIUM) 1250 (500 Ca) MG tablet Take 1 tablet by mouth. (Patient not taking: Reported on 07/04/2022)   No facility-administered medications prior to visit.    Review of Systems Review of Systems:  A fourteen system review of systems was performed  and found to be positive as per HPI.     Objective    BP 108/69   Pulse 75   Ht '5\' 9"'$  (1.753 m)   Wt 179 lb 1.9 oz (81.2 kg)   LMP 10/29/2000   SpO2 96%   BMI 26.45 kg/m  BP Readings from Last 3 Encounters:  07/19/22 108/69  07/06/22 132/75  07/04/22 130/82   Wt Readings from Last 3 Encounters:  07/19/22 179 lb 1.9 oz (81.2 kg)  07/06/22 180 lb (81.6 kg)  07/04/22 180 lb (81.6 kg)    Physical Exam  General:  Pleasant and cooperative, appropriate for stated age.  Neuro:  Alert and oriented,  extra-ocular muscles intact  HEENT:  Normocephalic, atraumatic, PERRL, no sinus tenderness, fluid behind both TM's (right worse), no redness or swelling of TM's or external canals, negative Pinna sign bilaterally, positive Tragus sign bilaterally, neck supple, +cervical adenopathy  Skin:  dry scaly patches over both tragus,  Cardiac:  RRR, S1 S2 Respiratory: CTA B/L  Vascular:  Ext warm, no cyanosis apprec.; cap RF less 2 sec. Psych:  No HI/SI, judgement and insight good, Euthymic mood. Full Affect.   No results found for any visits on 07/19/22.  Assessment & Plan      Problem List Items Addressed This Visit   None Visit Diagnoses     Dysfunction of both eustachian tubes    -  Primary   Relevant Medications   predniSONE (DELTASONE) 20 MG tablet  Other Relevant Orders   Ambulatory referral to ENT   Eczematoid otitis externa of both ears, unspecified chronicity       Relevant Medications   desonide (DESOWEN) 0.05 % cream   Other Relevant Orders   Ambulatory referral to ENT      Discussed with patient has s/sx more consistent with ETD vs untreated ear infection. Will trial short course of prednisone to help reduce congestion and fluid. Also recommend to restart taking her daily oral antihistamine- Xyzal. Also has signs of eczematoid otitis externa so will send rx for different topical corticosteroid, patient completed treatment with otic Cipro-Dex. Will place referral to ENT  for further evaluation and management. Patient verbalized understanding and agreeable.   Return if symptoms worsen or fail to improve.        Lorrene Reid, PA-C  [facility] at [facility] [phone] (phone) [phone] (fax)  [place]

## 2022-07-25 ENCOUNTER — Other Ambulatory Visit: Payer: Self-pay

## 2022-07-25 DIAGNOSIS — Z0000 Encounter for general adult medical examination without abnormal findings: Secondary | ICD-10-CM

## 2022-07-25 DIAGNOSIS — Z130 Encounter for screening for diseases of the blood and blood-forming organs and certain disorders involving the immune mechanism: Secondary | ICD-10-CM

## 2022-07-26 ENCOUNTER — Other Ambulatory Visit: Payer: Medicare Other

## 2022-07-26 DIAGNOSIS — Z13228 Encounter for screening for other metabolic disorders: Secondary | ICD-10-CM

## 2022-07-26 DIAGNOSIS — Z0000 Encounter for general adult medical examination without abnormal findings: Secondary | ICD-10-CM

## 2022-07-27 ENCOUNTER — Encounter: Payer: Self-pay | Admitting: Physician Assistant

## 2022-07-27 ENCOUNTER — Ambulatory Visit (INDEPENDENT_AMBULATORY_CARE_PROVIDER_SITE_OTHER): Payer: Medicare Other | Admitting: Physician Assistant

## 2022-07-27 VITALS — BP 112/65 | HR 78 | Resp 18 | Ht 69.0 in | Wt 180.0 lb

## 2022-07-27 DIAGNOSIS — Z6826 Body mass index (BMI) 26.0-26.9, adult: Secondary | ICD-10-CM

## 2022-07-27 DIAGNOSIS — Z0000 Encounter for general adult medical examination without abnormal findings: Secondary | ICD-10-CM

## 2022-07-27 DIAGNOSIS — H60543 Acute eczematoid otitis externa, bilateral: Secondary | ICD-10-CM

## 2022-07-27 DIAGNOSIS — E663 Overweight: Secondary | ICD-10-CM

## 2022-07-27 DIAGNOSIS — E039 Hypothyroidism, unspecified: Secondary | ICD-10-CM | POA: Diagnosis not present

## 2022-07-27 LAB — LIPID PANEL
Chol/HDL Ratio: 4 ratio (ref 0.0–4.4)
Cholesterol, Total: 182 mg/dL (ref 100–199)
HDL: 45 mg/dL (ref >39)
LDL Chol Calc (NIH): 109 mg/dL — ABNORMAL HIGH (ref 0–99)
Triglycerides: 160 mg/dL — ABNORMAL HIGH (ref 0–149)
VLDL Cholesterol Cal: 28 mg/dL (ref 5–40)

## 2022-07-27 LAB — COMPREHENSIVE METABOLIC PANEL
ALT: 17 IU/L (ref 0–32)
AST: 16 IU/L (ref 0–40)
Albumin/Globulin Ratio: 1.8 (ref 1.2–2.2)
Albumin: 4.5 g/dL (ref 3.9–4.9)
Alkaline Phosphatase: 65 IU/L (ref 44–121)
BUN/Creatinine Ratio: 16 (ref 12–28)
BUN: 14 mg/dL (ref 8–27)
Bilirubin Total: 0.5 mg/dL (ref 0.0–1.2)
CO2: 23 mmol/L (ref 20–29)
Calcium: 9.6 mg/dL (ref 8.7–10.3)
Chloride: 104 mmol/L (ref 96–106)
Creatinine, Ser: 0.85 mg/dL (ref 0.57–1.00)
Globulin, Total: 2.5 g/dL (ref 1.5–4.5)
Glucose: 92 mg/dL (ref 70–99)
Potassium: 4.6 mmol/L (ref 3.5–5.2)
Sodium: 140 mmol/L (ref 134–144)
Total Protein: 7 g/dL (ref 6.0–8.5)
eGFR: 76 mL/min/{1.73_m2} (ref >59)

## 2022-07-27 LAB — CBC WITH DIFFERENTIAL/PLATELET
Basophils Absolute: 0.1 10*3/uL (ref 0.0–0.2)
Basos: 2 %
EOS (ABSOLUTE): 0.2 10*3/uL (ref 0.0–0.4)
Eos: 3 %
Hematocrit: 41.6 % (ref 34.0–46.6)
Hemoglobin: 13.5 g/dL (ref 11.1–15.9)
Immature Grans (Abs): 0 10*3/uL (ref 0.0–0.1)
Immature Granulocytes: 0 %
Lymphocytes Absolute: 2.9 10*3/uL (ref 0.7–3.1)
Lymphs: 47 %
MCH: 27.4 pg (ref 26.6–33.0)
MCHC: 32.5 g/dL (ref 31.5–35.7)
MCV: 85 fL (ref 79–97)
Monocytes Absolute: 0.7 10*3/uL (ref 0.1–0.9)
Monocytes: 11 %
Neutrophils Absolute: 2.3 10*3/uL (ref 1.4–7.0)
Neutrophils: 37 %
Platelets: 316 10*3/uL (ref 150–450)
RBC: 4.92 x10E6/uL (ref 3.77–5.28)
RDW: 15.7 % — ABNORMAL HIGH (ref 11.7–15.4)
WBC: 6.2 10*3/uL (ref 3.4–10.8)

## 2022-07-27 LAB — HEMOGLOBIN A1C
Est. average glucose Bld gHb Est-mCnc: 117 mg/dL
Hgb A1c MFr Bld: 5.7 % — ABNORMAL HIGH (ref 4.8–5.6)

## 2022-07-27 LAB — TSH: TSH: 3.41 u[IU]/mL (ref 0.450–4.500)

## 2022-07-27 NOTE — Progress Notes (Signed)
Complete physical exam   Patient: [name]   DOB: 06-20-1956   66 y.o. Female  MRN: [id number] Visit Date: 07/27/2022   Chief Complaint  Patient presents with   Annual Exam   Subjective    [name] is a 66 y.o. female who presents today for a complete physical exam.  She reports consuming a  low fat and carbohydrate  diet.  Walking 1-2 miles daily with intermittent stretching.  She generally feels fairly well. She does not have additional problems to discuss today.     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 Hx of BLACK WIDOW SPIDER BITE x 2 left shoulder 10/11 with  "knot at base of scull"   seen by PCP- no neuro visit- resolved   Hepatitis    ? year- HEPATITIS C   Hypoglycemia    Hypothyroidism    Osteoporosis    Past Surgical History:  Procedure Laterality Date   CERVICAL CONE BIOPSY     COLONOSCOPY     EYE SURGERY     Lasic- left eye   JOINT REPLACEMENT N/A    [employer] 06/14/2020   laser conization     TONSILLECTOMY     TOTAL HIP ARTHROPLASTY  10/30/2011   Procedure: TOTAL HIP ARTHROPLASTY;  Surgeon: Magnus Sinning, MD;  Location: [facility];  Service: Orthopedics;  Laterality: Left;   Social History   Socioeconomic History   Marital status: Married    Spouse name: Not on file   Number of children: Not on file   Years of education: Not on file   Highest education level: Not on file  Occupational History   Not on file  Tobacco Use   Smoking status: Former    Packs/day: 0.50    Years: 38.00    Total pack years: 19.00    Types: Cigarettes    Quit date: 03/24/2016    Years since quitting: 6.3   Smokeless tobacco: Never   Tobacco comments:     5-6 cigarettes day  Substance and Sexual Activity   Alcohol use: Yes    Comment: occ   Drug use: No    Comment: marijuana/cocaine use in college   Sexual activity: Not Currently    Partners: Male  Other Topics Concern   Not on file  Social History Narrative   Not on file   Social Determinants of Health   Financial Resource Strain: Not on file  Food Insecurity: No Food Insecurity (09/21/2021)   Hunger Vital Sign    Worried About Running Out of Food in the Last Year: Never true    Ran Out of Food in the Last Year: Never true  Transportation Needs: No Transportation Needs (09/21/2021)   PRAPARE - Hydrologist (Medical): No    Lack of Transportation (Non-Medical): No  Physical Activity: Not on file  Stress: Not on file  Social Connections: Not on file  Intimate Partner Violence: Not on file     Medications: Outpatient Medications Prior to Visit  Medication Sig   Calcium Citrate-Vitamin D (CALCIUM CITRATE + PO) Take 1 tablet by  mouth daily.   cholecalciferol (VITAMIN D3) 25 MCG (1000 UNIT) tablet Take 1,000 Units by mouth daily.   desonide (DESOWEN) 0.05 % cream Apply topically 2 (two) times daily.   fluocinonide (LIDEX) 0.05 % external solution Apply 6.04 application topically 2 (two) times daily as needed.   Glucosamine-Chondroit-Vit C-Mn (GLUCOSAMINE 1500 COMPLEX PO) Take by mouth.   ketoconazole (NIZORAL) 2 % shampoo ketoconazole 2 % shampoo  LATHER ON SCALP AND EARS AND LEAVE ON FOR 5 TO 10 MINUTES, RINSE WELL. USE 1 TO 2 TIMES A WEEK.   magnesium 30 MG tablet Take 30 mg by mouth daily.   Probiotic Product (PROBIOTIC FORMULA PO) Take 1 capsule by mouth daily.   SYNTHROID 100 MCG tablet TAKE 1 TABLET BY MOUTH ONCE DAILY BEFORE BREAKFAST   calcium carbonate (OS-CAL - DOSED IN MG OF ELEMENTAL CALCIUM) 1250 (500 Ca) MG tablet Take 1 tablet by mouth. (Patient not taking: Reported on 07/04/2022)   [DISCONTINUED] amoxicillin-clavulanate (AUGMENTIN)  875-125 MG tablet Take 1 tablet by mouth 2 (two) times daily. (Patient not taking: Reported on 07/27/2022)   [DISCONTINUED] ciprofloxacin-dexamethasone (CIPRODEX) OTIC suspension Place 4 drops into both ears 2 (two) times daily. (Patient not taking: Reported on 07/27/2022)   No facility-administered medications prior to visit.    Review of Systems Review of Systems:  A fourteen system review of systems was performed and found to be positive as per HPI.  Last CBC Lab Results  Component Value Date   WBC 6.2 07/26/2022   HGB 13.5 07/26/2022   HCT 41.6 07/26/2022   MCV 85 07/26/2022   MCH 27.4 07/26/2022   RDW 15.7 (H) 07/26/2022   PLT 316 54/05/8118   Last metabolic panel Lab Results  Component Value Date   GLUCOSE 92 07/26/2022   NA 140 07/26/2022   K 4.6 07/26/2022   CL 104 07/26/2022   CO2 23 07/26/2022   BUN 14 07/26/2022   CREATININE 0.85 07/26/2022   EGFR 76 07/26/2022   CALCIUM 9.6 07/26/2022   PHOS 4.0 06/06/2016   PROT 7.0 07/26/2022   ALBUMIN 4.5 07/26/2022   LABGLOB 2.5 07/26/2022   AGRATIO 1.8 07/26/2022   BILITOT 0.5 07/26/2022   ALKPHOS 65 07/26/2022   AST 16 07/26/2022   ALT 17 07/26/2022   Last lipids Lab Results  Component Value Date   CHOL 182 07/26/2022   HDL 45 07/26/2022   LDLCALC 109 (H) 07/26/2022   TRIG 160 (H) 07/26/2022   CHOLHDL 4.0 07/26/2022   Last hemoglobin A1c Lab Results  Component Value Date   HGBA1C 5.7 (H) 07/26/2022   Last thyroid functions Lab Results  Component Value Date   TSH 3.410 07/26/2022   T3TOTAL 119 12/07/2021   Last vitamin D Lab Results  Component Value Date   VD25OH 32.6 06/09/2020      Objective    BP 112/65 (BP Location: Left Arm, Patient Position: Sitting, Cuff Size: Normal)   Pulse 78   Resp 18   Ht _0  (1.753 m)   Wt 180 lb (81.6 kg)   LMP 10/29/2000   SpO2 94%   BMI 26.58 kg/m  BP Readings from Last 3 Encounters:  07/27/22 112/65  07/19/22 108/69  07/06/22 132/75   Wt Readings  from Last 3 Encounters:  07/27/22 180 lb (81.6 kg)  07/19/22 179 lb 1.9 oz (81.2 kg)  07/06/22 180 lb (81.6 kg)    Physical Exam   General Appearance:       Alert, cooperative, in no  acute distress, appears stated age   Head:    Normocephalic, without obvious abnormality, atraumatic  Eyes:    PERRL, conjunctiva/corneas clear, EOM's intact, fundi    benign, both eyes  Ears:    Opaque TM's of both ears w/o swelling or redness, dry and scaly external ear canals of both ears  Nose:   Nares normal, septum midline, mucosa normal, no drainage    or sinus tenderness  Throat:   Lips, mucosa, and tongue normal; teeth and gums normal  Neck:   Supple, symmetrical, trachea midline, no adenopathy;    thyroid:  no enlargement/tenderness/nodules; no JVD  Back:     Symmetric, no curvature, ROM normal, no CVA tenderness  Lungs:     Clear to auscultation bilaterally, respirations unlabored  Chest Wall:    No tenderness or deformity   Heart:    Normal heart rate. Normal rhythm. No murmurs, rubs, or gallops.   Breast Exam:    deferred  Abdomen:     Soft, non-tender, bowel sounds active all four quadrants,    no masses, no organomegaly  Pelvic:    deferred  Extremities:   All extremities are intact. No cyanosis or edema  Pulses:   2+ and symmetric all extremities  Skin:   Skin color, texture, turgor normal, no rashes or lesions  Lymph nodes:   Cervical, supraclavicular nodes normal  Neurologic:   CNII-XII grossly intact.      Last depression screening scores    07/27/2022   10:50 AM 07/19/2022    1:34 PM 07/04/2022    2:04 PM  PHQ 2/9 Scores  PHQ - 2 Score _0 PHQ- 9 Score _1 Last fall risk screening    07/27/2022   10:51 AM  [place] in the past year? 0  Number falls in past yr: 0  Injury with Fall? 0     No results found for any visits on 07/27/22.  Assessment & Plan    Routine Health Maintenance and Physical Exam  Exercise Activities and Dietary  recommendations -Discussed heart healthy diet low in fat and carbohydrates. Recommend moderate exercise 150 mins/wk.  Immunization History  Administered Date(s) Administered   Hepatitis A, Adult 06/01/2021, 03/21/2022   Hepatitis B, adult 06/01/2021, 07/01/2021   Influenza-Unspecified 06/01/2021   PFIZER(Purple Top)SARS-COV-2 Vaccination 11/05/2019, 11/28/2019   Pfizer Covid-19 Vaccine Bivalent Booster 36yr & up 06/19/2020, 04/12/2021   Tdap 09/24/2005, 01/07/2017    Health Maintenance  Topic Date Due   Medicare Annual Wellness (AWV)  Never done   Pneumonia Vaccine 65 Years old (1 - PCV) Never done   HIV Screening  Never done   Zoster Vaccines- Shingrix (1 of 2) Never done   COVID-19 Vaccine (4 - Pfizer series) 10/18/2021   INFLUENZA VACCINE  12/23/2022 (Originally 04/24/2022)   MAMMOGRAM  07/23/2023   PAP SMEAR-Modifier  09/21/2024   TETANUS/TDAP  01/08/2027   COLONOSCOPY (Pts 45-459yrInsurance coverage will need to be confirmed)  10/25/2029   DEXA SCAN  Completed   Hepatitis C Screening  Completed   HPV VACCINES  Aged Out    Discussed health benefits of physical activity, and encouraged her to engage in regular exercise appropriate for her age and condition.  Problem List Items Addressed This Visit       Endocrine   Hypothyroidism (Chronic)     Other   Healthcare maintenance - Primary   Other Visit Diagnoses     Eczematoid otitis  externa of both ears, unspecified chronicity       Overweight with body mass index (BMI) of 26 to 26.9 in adult          Discussed with patient most recent lab results which are essentially within normal limits or stable from prior. A1c has improved 5.9 to 5.7. Bad cholesterol remains mildly elevated. Recommend to continue with diet and lifestyle interventions.  Pt deferred flu vaccine. UTD pap, patient plans to schedule mammogram for this year. UTD colonoscopy.  Discussed weight loss with increasing physical activity and continue with  a heart healthy diet. TSH remains wnl's, recommend to continue Synthroid 100 mcg daily. Patient waiting on ENT consult for eczema otitis externa. Follow-up in 1 year for CPE and FBW or sooner if needed.  Return in about 1 year (around 07/28/2023) for CPE and FBW.       Lorrene Reid, PA-C  [facility] [phone] (phone) [phone] (fax)  [place]

## 2022-07-27 NOTE — Patient Instructions (Signed)
Preventive Care 65 Years and Older, Female Preventive care refers to lifestyle choices and visits with your health care provider that can promote health and wellness. Preventive care visits are also called wellness exams. What can I expect for my preventive care visit? Counseling Your health care provider may ask you questions about your: Medical history, including: Past medical problems. Family medical history. Pregnancy and menstrual history. History of falls. Current health, including: Memory and ability to understand (cognition). Emotional well-being. Home life and relationship well-being. Sexual activity and sexual health. Lifestyle, including: Alcohol, nicotine or tobacco, and drug use. Access to firearms. Diet, exercise, and sleep habits. Work and work environment. Sunscreen use. Safety issues such as seatbelt and bike helmet use. Physical exam Your health care provider will check your: Height and weight. These may be used to calculate your BMI (body mass index). BMI is a measurement that tells if you are at a healthy weight. Waist circumference. This measures the distance around your waistline. This measurement also tells if you are at a healthy weight and may help predict your risk of certain diseases, such as type 2 diabetes and high blood pressure. Heart rate and blood pressure. Body temperature. Skin for abnormal spots. What immunizations do I need?  Vaccines are usually given at various ages, according to a schedule. Your health care provider will recommend vaccines for you based on your age, medical history, and lifestyle or other factors, such as travel or where you work. What tests do I need? Screening Your health care provider may recommend screening tests for certain conditions. This may include: Lipid and cholesterol levels. Hepatitis C test. Hepatitis B test. HIV (human immunodeficiency virus) test. STI (sexually transmitted infection) testing, if you are at  risk. Lung cancer screening. Colorectal cancer screening. Diabetes screening. This is done by checking your blood sugar (glucose) after you have not eaten for a while (fasting). Mammogram. Talk with your health care provider about how often you should have regular mammograms. BRCA-related cancer screening. This may be done if you have a family history of breast, ovarian, tubal, or peritoneal cancers. Bone density scan. This is done to screen for osteoporosis. Talk with your health care provider about your test results, treatment options, and if necessary, the need for more tests. Follow these instructions at home: Eating and drinking  Eat a diet that includes fresh fruits and vegetables, whole grains, lean protein, and low-fat dairy products. Limit your intake of foods with high amounts of sugar, saturated fats, and salt. Take vitamin and mineral supplements as recommended by your health care provider. Do not drink alcohol if your health care provider tells you not to drink. If you drink alcohol: Limit how much you have to 0-1 drink a day. Know how much alcohol is in your drink. In the [place] one drink equals one 12 oz bottle of beer (355 mL), one 5 oz glass of wine (148 mL), or one 1 oz glass of hard liquor (44 mL). Lifestyle Brush your teeth every morning and night with fluoride toothpaste. Floss one time each day. Exercise for at least 30 minutes 5 or more days each week. Do not use any products that contain nicotine or tobacco. These products include cigarettes, chewing tobacco, and vaping devices, such as e-cigarettes. If you need help quitting, ask your health care provider. Do not use drugs. If you are sexually active, practice safe sex. Use a condom or other form of protection in order to prevent STIs. Take aspirin only as told by   your health care provider. Make sure that you understand how much to take and what form to take. Work with your health care provider to find out whether it  is safe and beneficial for you to take aspirin daily. Ask your health care provider if you need to take a cholesterol-lowering medicine (statin). Find healthy ways to manage stress, such as: Meditation, yoga, or listening to music. Journaling. Talking to a trusted person. Spending time with friends and family. Minimize exposure to UV radiation to reduce your risk of skin cancer. Safety Always wear your seat belt while driving or riding in a vehicle. Do not drive: If you have been drinking alcohol. Do not ride with someone who has been drinking. When you are tired or distracted. While texting. If you have been using any mind-altering substances or drugs. Wear a helmet and other protective equipment during sports activities. If you have firearms in your house, make sure you follow all gun safety procedures. What's next? Visit your health care provider once a year for an annual wellness visit. Ask your health care provider how often you should have your eyes and teeth checked. Stay up to date on all vaccines. This information is not intended to replace advice given to you by your health care provider. Make sure you discuss any questions you have with your health care provider. Document Revised: 03/08/2021 Document Reviewed: 03/08/2021 Elsevier Patient Education  2023 [employer]  

## 2022-08-20 ENCOUNTER — Other Ambulatory Visit: Payer: Self-pay | Admitting: Certified Nurse Midwife

## 2022-08-20 ENCOUNTER — Other Ambulatory Visit: Payer: Self-pay | Admitting: Physician Assistant

## 2022-08-20 ENCOUNTER — Other Ambulatory Visit: Payer: Self-pay | Admitting: Cardiology

## 2022-08-20 ENCOUNTER — Other Ambulatory Visit: Payer: Self-pay | Admitting: Adult Health

## 2022-08-20 DIAGNOSIS — Z1231 Encounter for screening mammogram for malignant neoplasm of breast: Secondary | ICD-10-CM

## 2022-08-22 ENCOUNTER — Ambulatory Visit
Admission: RE | Admit: 2022-08-22 | Discharge: 2022-08-22 | Disposition: A | Discharge status: Home / Self Care | Payer: Medicare Other | Source: Ambulatory Visit

## 2022-08-22 DIAGNOSIS — Z1231 Encounter for screening mammogram for malignant neoplasm of breast: Secondary | ICD-10-CM

## 2022-10-01 DIAGNOSIS — H6063 Unspecified chronic otitis externa, bilateral: Secondary | ICD-10-CM | POA: Diagnosis not present

## 2022-10-01 DIAGNOSIS — L309 Dermatitis, unspecified: Secondary | ICD-10-CM | POA: Diagnosis not present

## 2022-10-10 ENCOUNTER — Other Ambulatory Visit: Payer: Self-pay | Admitting: Nurse Practitioner

## 2022-10-10 DIAGNOSIS — E039 Hypothyroidism, unspecified: Secondary | ICD-10-CM

## 2022-10-15 DIAGNOSIS — H6063 Unspecified chronic otitis externa, bilateral: Secondary | ICD-10-CM | POA: Diagnosis not present

## 2022-11-12 DIAGNOSIS — H6063 Unspecified chronic otitis externa, bilateral: Secondary | ICD-10-CM | POA: Diagnosis not present

## 2022-11-27 DIAGNOSIS — M1611 Unilateral primary osteoarthritis, right hip: Secondary | ICD-10-CM | POA: Diagnosis not present

## 2022-12-17 DIAGNOSIS — H608X3 Other otitis externa, bilateral: Secondary | ICD-10-CM | POA: Diagnosis not present

## 2022-12-17 DIAGNOSIS — L309 Dermatitis, unspecified: Secondary | ICD-10-CM | POA: Diagnosis not present

## 2022-12-18 ENCOUNTER — Encounter: Payer: Self-pay | Admitting: Family Medicine

## 2022-12-18 ENCOUNTER — Ambulatory Visit (INDEPENDENT_AMBULATORY_CARE_PROVIDER_SITE_OTHER): Payer: Medicare Other | Admitting: Family Medicine

## 2022-12-18 VITALS — BP 129/88 | HR 78 | Resp 18 | Ht 69.0 in | Wt 182.0 lb

## 2022-12-18 DIAGNOSIS — E039 Hypothyroidism, unspecified: Secondary | ICD-10-CM | POA: Diagnosis not present

## 2022-12-18 DIAGNOSIS — Z0000 Encounter for general adult medical examination without abnormal findings: Secondary | ICD-10-CM

## 2022-12-18 DIAGNOSIS — Z780 Asymptomatic menopausal state: Secondary | ICD-10-CM | POA: Diagnosis not present

## 2022-12-18 NOTE — Progress Notes (Signed)
Subjective:   [name] is a 67 y.o. female who presents for Medicare Annual (Subsequent) preventive examination.  Review of Systems    Review of Systems  All other systems reviewed and are negative.   Cardiac Risk Factors include: none     Objective:    Today's Vitals   12/18/22 1430 12/18/22 1431  BP: 129/88   Pulse: 78   Resp: 18   SpO2: 93%   Weight: 182 lb (82.6 kg)   Height: 5\' 9"  (1.753 m)   PainSc: 0-No pain 0-No pain   Body mass index is 26.88 kg/m.     06/04/2016   10:03 AM 10/30/2011   10:40 AM 10/23/2011    2:03 PM  Advanced Directives  Does Patient Have a Medical Advance Directive? No Patient does not have advance directive Patient does not have advance directive  Would patient like information on creating a medical advance directive? No - patient declined information      Current Medications (verified) Outpatient Encounter Medications as of 12/18/2022  Medication Sig   Calcium-Magnesium-Vitamin D 300-150-400 MG-MG-UNIT TABS Take 1 tablet by mouth daily.   desonide (DESOWEN) 0.05 % cream Apply topically 2 (two) times daily.   fluocinonide (LIDEX) 0.05 % external solution Apply [id number] application topically 2 (two) times daily as needed.   Glucosamine-Chondroit-Vit C-Mn (GLUCOSAMINE 1500 COMPLEX PO) Take by mouth.   Probiotic Product (PROBIOTIC FORMULA PO) Take 1 capsule by mouth daily.   SYNTHROID 100 MCG tablet TAKE 1 TABLET BY MOUTH ONCE DAILY BEFORE BREAKFAST   Calcium Citrate-Vitamin D (CALCIUM CITRATE + PO) Take 1 tablet by mouth daily.   magnesium 30 MG tablet Take 30 mg by mouth daily.   [DISCONTINUED] calcium carbonate (OS-CAL - DOSED IN MG OF ELEMENTAL CALCIUM) 1250 (500 Ca) MG tablet Take 1 tablet by mouth. (Patient not taking: Reported on 07/04/2022)   [DISCONTINUED] cholecalciferol (VITAMIN D3) 25 MCG (1000 UNIT) tablet Take 1,000 Units by mouth daily.   [DISCONTINUED] ketoconazole (NIZORAL) 2 % shampoo ketoconazole 2 % shampoo  LATHER  ON SCALP AND EARS AND LEAVE ON FOR 5 TO 10 MINUTES, RINSE WELL. USE 1 TO 2 TIMES A WEEK.   No facility-administered encounter medications on file as of 12/18/2022.    Allergies (verified) Patient has no known allergies.   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 Hx of BLACK WIDOW SPIDER BITE x 2 left shoulder 10/11 with  "knot at base of scull"   seen by PCP- no neuro visit- resolved   Hepatitis    ? year- HEPATITIS C   Hypoglycemia    Hypothyroidism    Osteoporosis    Past Surgical History:  Procedure Laterality Date   CERVICAL CONE BIOPSY     COLONOSCOPY     EYE SURGERY     Lasic- left eye   JOINT REPLACEMENT N/A    [employer] 06/14/2020   laser conization     TONSILLECTOMY     TOTAL HIP ARTHROPLASTY  10/30/2011   Procedure: TOTAL HIP ARTHROPLASTY;  Surgeon: Magnus Sinning, MD;  Location: [facility];  Service: Orthopedics;  Laterality: Left;  Family History  Adopted: Yes   Social History   Socioeconomic History   Marital status: Married    Spouse name: Not on file   Number of children: Not on file   Years of education: Not on file   Highest education level: Not on file  Occupational History   Not on file  Tobacco Use   Smoking status: Former    Packs/day: 0.50    Years: 38.00    Additional pack years: 0.00    Total pack years: 19.00    Types: Cigarettes    Quit date: 03/24/2016    Years since quitting: 6.7    Passive exposure: Never   Smokeless tobacco: Never   Tobacco comments:    5-6 cigarettes day  Vaping Use   Vaping Use: Never used  Substance and Sexual Activity   Alcohol use: Yes    Comment: occ   Drug use: No    Comment: marijuana/cocaine use in  college   Sexual activity: Not Currently    Partners: Male  Other Topics Concern   Not on file  Social History Narrative   Not on file   Social Determinants of Health   Financial Resource Strain: Low Risk  (12/18/2022)   Overall Financial Resource Strain (CARDIA)    Difficulty of Paying Living Expenses: Not hard at all  Food Insecurity: No Food Insecurity (12/18/2022)   Hunger Vital Sign    Worried About Running Out of Food in the Last Year: Never true    Ran Out of Food in the Last Year: Never true  Transportation Needs: No Transportation Needs (09/21/2021)   PRAPARE - Hydrologist (Medical): No    Lack of Transportation (Non-Medical): No  Physical Activity: Sufficiently Active (12/18/2022)   Exercise Vital Sign    Days of Exercise per Week: 5 days    Minutes of Exercise per Session: 30 min  Stress: No Stress Concern Present (12/18/2022)   [place]    Feeling of Stress : Not at all  Social Connections: Moderately Integrated (12/18/2022)   Social Connection and Isolation Panel [NHANES]    Frequency of Communication with Friends and Family: Once a week    Frequency of Social Gatherings with Friends and Family: Once a week    Attends Religious Services: More than 4 times per year    Active Member of [employer] or Organizations: Yes    Attends Music therapist: More than 4 times per year    Marital Status: Married    Tobacco Counseling Counseling given: Not Answered Tobacco comments: 5-6 cigarettes day   Clinical Intake:  Pre-visit preparation completed: No  Pain : No/denies pain Pain Score: 0-No pain     BMI - recorded: 26.88 Nutritional Status: BMI 25 -29 Overweight Nutritional Risks: None Diabetes: No  How often do you need to have someone help you when you read instructions, pamphlets, or other written materials from your doctor or pharmacy?: 1 -  Never  Diabetic? No  Interpreter Needed?: No  Information entered by :: Leanord Asal, RMA   Activities of Daily Living    12/18/2022    2:35 PM 12/15/2022    8:02 PM  In your present state of health, do you have any difficulty performing the following activities:  Hearing? 0 0  Vision? 0 0  Difficulty concentrating or making decisions? 0 0  Walking or climbing stairs? 0 0  Dressing or bathing? 0  0  Doing errands, shopping? 0 0  Preparing Food and eating ? N N  Using the Toilet? N N  In the past six months, have you accidently leaked urine? N N  Do you have problems with loss of bowel control? N N  Managing your Medications? N N  Managing your Finances? N N  Housekeeping or managing your Housekeeping? Tempie Donning    Patient Care Team: Velva Harman, PA as PCP - General (Family Medicine) [place], Laurice Record, MD (Inactive) as Consulting Physician (Orthopedic Surgery) Paralee Cancel, MD as Consulting Physician (Orthopedic Surgery) Arta Silence, MD as Consulting Physician (Gastroenterology) Druscilla Brownie, MD as Consulting Physician (Dermatology) Landis Martins, DPM as Consulting Physician (Podiatry)  Indicate any recent Medical Services you may have received from other than [facility] providers in the past year (date may be approximate).     Assessment:   This is a routine wellness examination for [place].  Hearing/Vision screen No results found.  Dietary issues and exercise activities discussed: Current Exercise Habits: The patient does not participate in regular exercise at present, Exercise limited by: None identified   Goals Addressed               This Visit's Progress     Patient Stated (pt-stated)        Patient would like to lose weight this year.  She has been gaining 3 to 5 pounds a year ever since she went through menopause, but this past year since she had her hip surgery and has been able to walk again she has been able to maintain her weight.  She is  planning on continuing with a calorie deficit and walking, but we discussed that there may be medication support options if necessary in the future.      Depression Screen    12/18/2022    2:34 PM 07/27/2022   10:50 AM 07/19/2022    1:34 PM 07/04/2022    2:04 PM 12/07/2021    9:49 AM 09/21/2021    2:29 PM 07/18/2021    2:42 PM  PHQ 2/9 Scores  PHQ - 2 Score 1 1 1 1  0 0 2  PHQ- 9 Score 8 6 5 5 5 2 6     Fall Risk    12/18/2022    2:34 PM 12/15/2022    8:02 PM 07/27/2022   10:51 AM 07/04/2022    2:04 PM 12/07/2021    9:49 AM  Fall Risk   Falls in the past year? 0 0 0 0 0  Number falls in past yr: 0 0 0 0 0  Injury with Fall? 0 0 0 0 0  Risk for fall due to : No Fall Risks   No Fall Risks No Fall Risks  Follow up    Falls evaluation completed Falls evaluation completed    FALL RISK PREVENTION PERTAINING TO THE HOME:  Any stairs in or around the home? Yes  If so, are there any without handrails? No  Home free of loose throw rugs in walkways, pet beds, electrical cords, etc? Yes  Adequate lighting in your home to reduce risk of falls? Yes   ASSISTIVE DEVICES UTILIZED TO PREVENT FALLS:  Life alert? No  Use of a cane, walker or w/c? No  Grab bars in the bathroom? No  Shower chair or bench in shower? No  Elevated toilet seat or a handicapped toilet? Yes   TIMED UP AND GO:  Was the test performed? Yes .  Length of time  to ambulate 10 feet: 10-15 sec.   Gait steady and fast without use of assistive device  Cognitive Function:        Immunizations Immunization History  Administered Date(s) Administered   Hepatitis A, Adult 06/01/2021, 03/21/2022   Hepatitis B, ADULT 06/01/2021, 07/01/2021   Influenza-Unspecified 06/01/2021   PFIZER Comirnaty(Gray Top)Covid-19 Tri-Sucrose Vaccine 11/05/2019, 11/28/2019   PFIZER(Purple Top)SARS-COV-2 Vaccination 11/05/2019, 11/28/2019   Pfizer Covid-19 Vaccine Bivalent Booster 49yrs & up 06/19/2020, 04/12/2021   Tdap 09/24/2005,  01/07/2017    TDAP status: Up to date  Flu Vaccine status: Up to date  Pneumococcal vaccine status: Due, Education has been provided regarding the importance of this vaccine. Advised may receive this vaccine at local pharmacy or Health Dept. Aware to provide a copy of the vaccination record if obtained from local pharmacy or Health Dept. Verbalized acceptance and understanding.  Covid-19 vaccine status: Information provided on how to obtain vaccines.   Qualifies for Shingles Vaccine? Yes   Zostavax completed No   Shingrix Completed?: No.    Education has been provided regarding the importance of this vaccine. Patient has been advised to call insurance company to determine out of pocket expense if they have not yet received this vaccine. Advised may also receive vaccine at local pharmacy or Health Dept. Verbalized acceptance and understanding.  Screening Tests Health Maintenance  Topic Date Due   COVID-19 Vaccine (7 - 2023-24 season) 05/25/2022   INFLUENZA VACCINE  12/23/2022 (Originally 04/24/2022)   Zoster Vaccines- Shingrix (1 of 2) 03/20/2023 (Originally 10/18/2006)   Pneumonia Vaccine 51+ Years old (1 of 1 - PCV) 12/18/2023 (Originally 10/18/2021)   Medicare Annual Wellness (AWV)  12/18/2023   MAMMOGRAM  08/22/2024   DTaP/Tdap/Td (3 - Td or Tdap) 01/08/2027   COLONOSCOPY (Pts 45-66yrs Insurance coverage will need to be confirmed)  10/25/2029   DEXA SCAN  Completed   Hepatitis C Screening  Completed   HPV VACCINES  Aged Out    Health Maintenance  Health Maintenance Due  Topic Date Due   COVID-19 Vaccine (7 - 2023-24 season) 05/25/2022    Colorectal cancer screening: Type of screening: Colonoscopy. Completed 10/26/2019. Repeat every 10 years  Mammogram status: Completed 08/22/2022. Repeat every year  Bone Density status: Ordered 12/18/2022. Pt provided with contact info and advised to call to schedule appt.  Lung Cancer Screening: (Low Dose CT Chest recommended if Age 23-80  years, 30 pack-year currently smoking OR have quit w/in 15years.) does not qualify.     Additional Screening:  Hepatitis C Screening: does qualify; Completed 12/18/202  Vision Screening: Recommended annual ophthalmology exams for early detection of glaucoma and other disorders of the eye. Is the patient up to date with their annual eye exam?  No  Who is the provider or what is the name of the office in which the patient attends annual eye exams? [facility] If pt is not established with a provider, would they like to be referred to a provider to establish care?  Established .   Dental Screening: Recommended annual dental exams for proper oral hygiene  Community Resource Referral / Chronic Care Management: CRR required this visit?  No   CCM required this visit?  No      Plan:    Patient would like to read some more information about the pneumonia vaccine and shingles vaccine before she gets them.  Provided handouts with printed information in her after visit summary.  I have personally reviewed and noted the following in the  patient's chart:   Medical and social history Use of alcohol, tobacco or illicit drugs  Current medications and supplements including opioid prescriptions. Patient is not currently taking opioid prescriptions. Functional ability and status Nutritional status Physical activity Advanced directives List of other physicians Hospitalizations, surgeries, and ER visits in previous 12 months Vitals Screenings to include cognitive, depression, and falls Referrals and appointments  In addition, I have reviewed and discussed with patient certain preventive protocols, quality metrics, and best practice recommendations. A written personalized care plan for preventive services as well as general preventive health recommendations were provided to patient.    Return in 6 months (on 06/20/2023) for annual physical, fasting blood work 1 week before.  Velva Harman, PA   12/18/2022   Nurse Notes: Face to Face 20 minutes.

## 2022-12-18 NOTE — Patient Instructions (Signed)
We will check in in about 6 months and gets a full set of lab work including cholesterol and A1c.  Keep up the good work, and when I see you again we can discuss if you need any additional support.  You've got this!

## 2022-12-19 LAB — CBC WITH DIFFERENTIAL/PLATELET
Basophils Absolute: 0.1 10*3/uL (ref 0.0–0.2)
Basos: 1 %
EOS (ABSOLUTE): 0.2 10*3/uL (ref 0.0–0.4)
Eos: 3 %
Hematocrit: 43.9 % (ref 34.0–46.6)
Hemoglobin: 14.6 g/dL (ref 11.1–15.9)
Immature Grans (Abs): 0 10*3/uL (ref 0.0–0.1)
Immature Granulocytes: 0 %
Lymphocytes Absolute: 2.3 10*3/uL (ref 0.7–3.1)
Lymphs: 37 %
MCH: 29.5 pg (ref 26.6–33.0)
MCHC: 33.3 g/dL (ref 31.5–35.7)
MCV: 89 fL (ref 79–97)
Monocytes Absolute: 0.7 10*3/uL (ref 0.1–0.9)
Monocytes: 11 %
Neutrophils Absolute: 2.9 10*3/uL (ref 1.4–7.0)
Neutrophils: 48 %
Platelets: 340 10*3/uL (ref 150–450)
RBC: 4.95 x10E6/uL (ref 3.77–5.28)
RDW: 13.3 % (ref 11.7–15.4)
WBC: 6.1 10*3/uL (ref 3.4–10.8)

## 2022-12-19 LAB — COMPREHENSIVE METABOLIC PANEL
ALT: 19 IU/L (ref 0–32)
AST: 16 IU/L (ref 0–40)
Albumin/Globulin Ratio: 1.5 (ref 1.2–2.2)
Albumin: 4.4 g/dL (ref 3.9–4.9)
Alkaline Phosphatase: 64 IU/L (ref 44–121)
BUN/Creatinine Ratio: 19 (ref 12–28)
BUN: 16 mg/dL (ref 8–27)
Bilirubin Total: 0.4 mg/dL (ref 0.0–1.2)
CO2: 22 mmol/L (ref 20–29)
Calcium: 10.6 mg/dL — ABNORMAL HIGH (ref 8.7–10.3)
Chloride: 104 mmol/L (ref 96–106)
Creatinine, Ser: 0.83 mg/dL (ref 0.57–1.00)
Globulin, Total: 2.9 g/dL (ref 1.5–4.5)
Glucose: 93 mg/dL (ref 70–99)
Potassium: 4.5 mmol/L (ref 3.5–5.2)
Sodium: 141 mmol/L (ref 134–144)
Total Protein: 7.3 g/dL (ref 6.0–8.5)
eGFR: 78 mL/min/{1.73_m2} (ref >59)

## 2022-12-19 LAB — TSH: TSH: 2.36 u[IU]/mL (ref 0.450–4.500)

## 2022-12-19 LAB — T4, FREE: Free T4: 1.39 ng/dL (ref 0.82–1.77)

## 2023-01-07 ENCOUNTER — Other Ambulatory Visit: Payer: Self-pay | Admitting: Family Medicine

## 2023-01-07 DIAGNOSIS — Z1231 Encounter for screening mammogram for malignant neoplasm of breast: Secondary | ICD-10-CM

## 2023-01-08 DIAGNOSIS — H6063 Unspecified chronic otitis externa, bilateral: Secondary | ICD-10-CM | POA: Diagnosis not present

## 2023-02-21 DIAGNOSIS — H6063 Unspecified chronic otitis externa, bilateral: Secondary | ICD-10-CM | POA: Diagnosis not present

## 2023-03-05 IMAGING — MG MM DIGITAL DIAGNOSTIC UNILAT*R* W/ TOMO W/ CAD
6 series · 6 of 18 positions shown · non-contrast
Comparison: Multiple prior exams.
COMPARISON: Multiple prior exams.

Addendum:
CLINICAL DATA: Patient returns after screening study for evaluation
possible LEFT breast distortion.

EXAM:
DIGITAL DIAGNOSTIC UNILATERAL RIGHT MAMMOGRAM WITH TOMOSYNTHESIS AND
CAD
TECHNIQUE: Right digital diagnostic mammography and breast tomosynthesis was
performed. The images were evaluated with computer-aided detection.

[R CC synth-2D (1 of 2)]
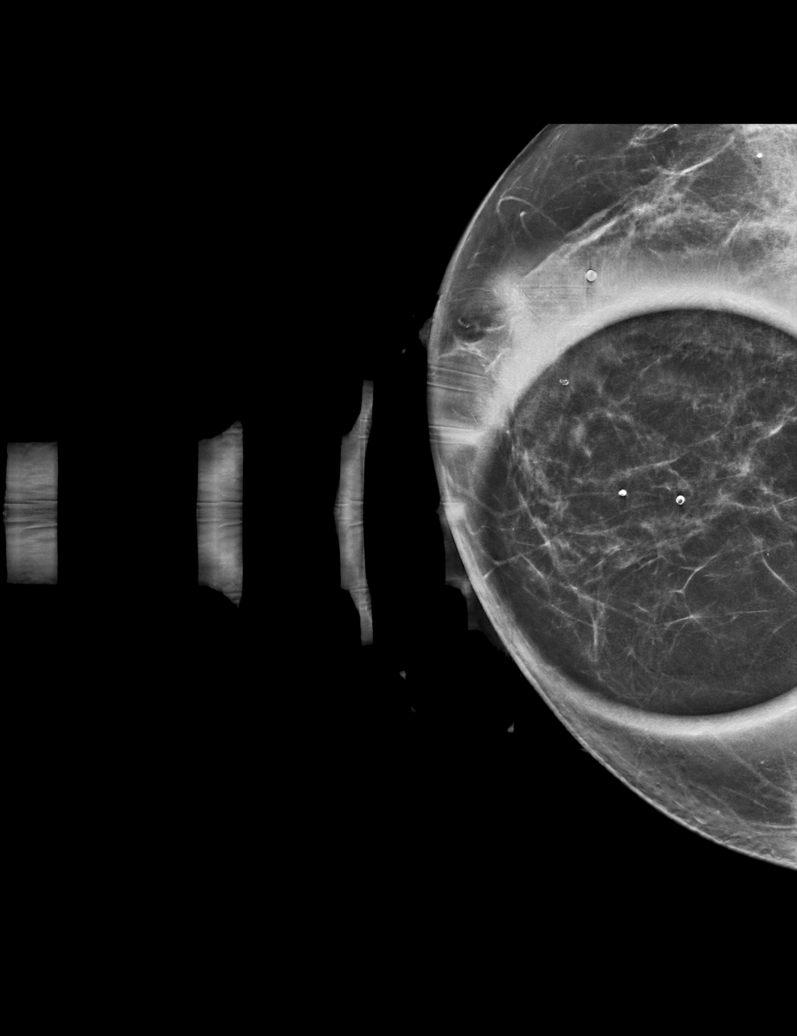

[R CC synth-2D (2 of 2)]
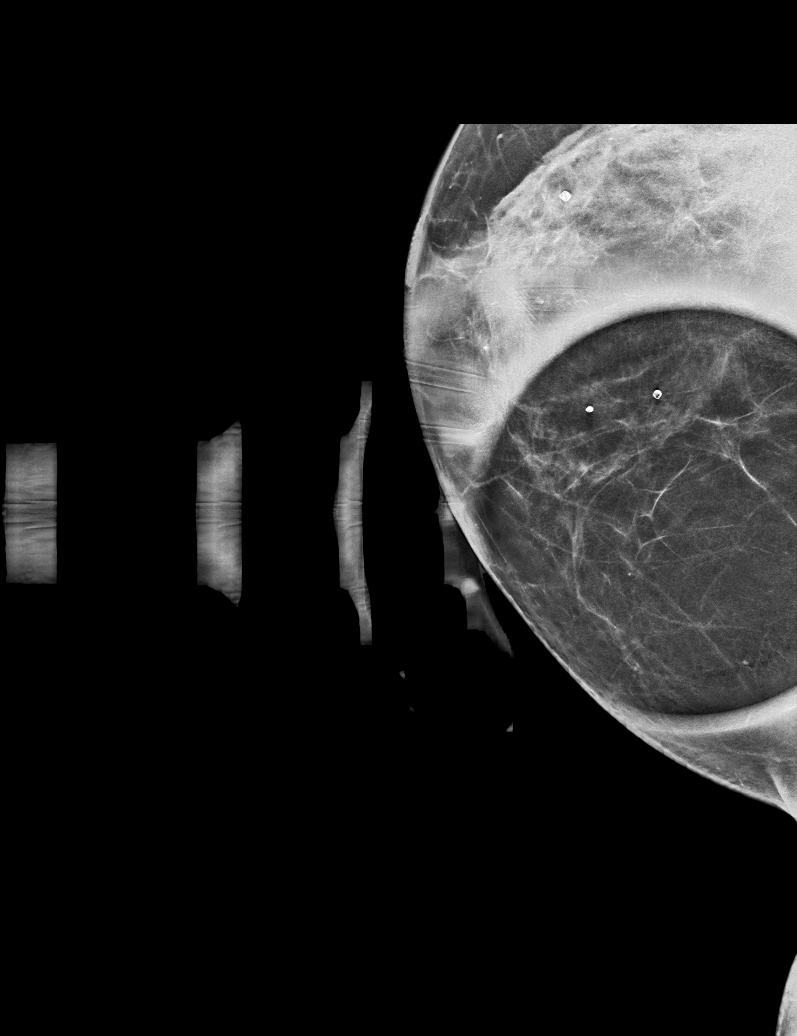

[R ML synth-2D]
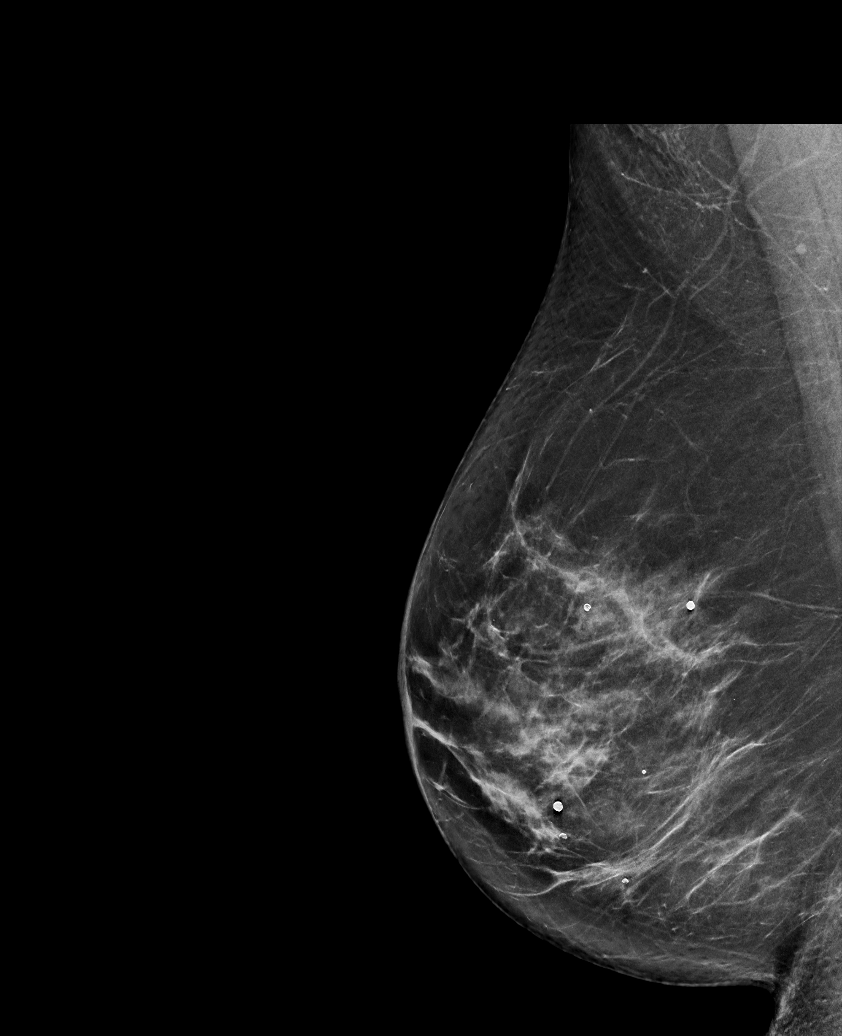

[R CC tomo (1 of 2) · tomo slice 31/60.0]
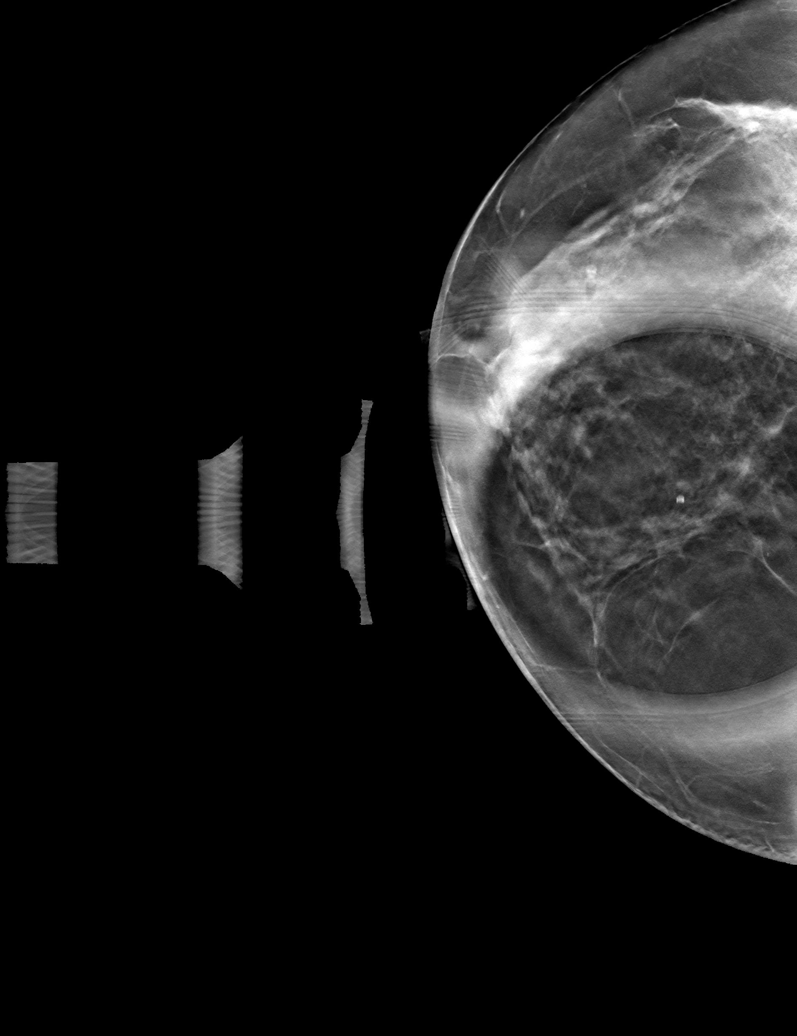

[R ML tomo · tomo slice 41/82.0]
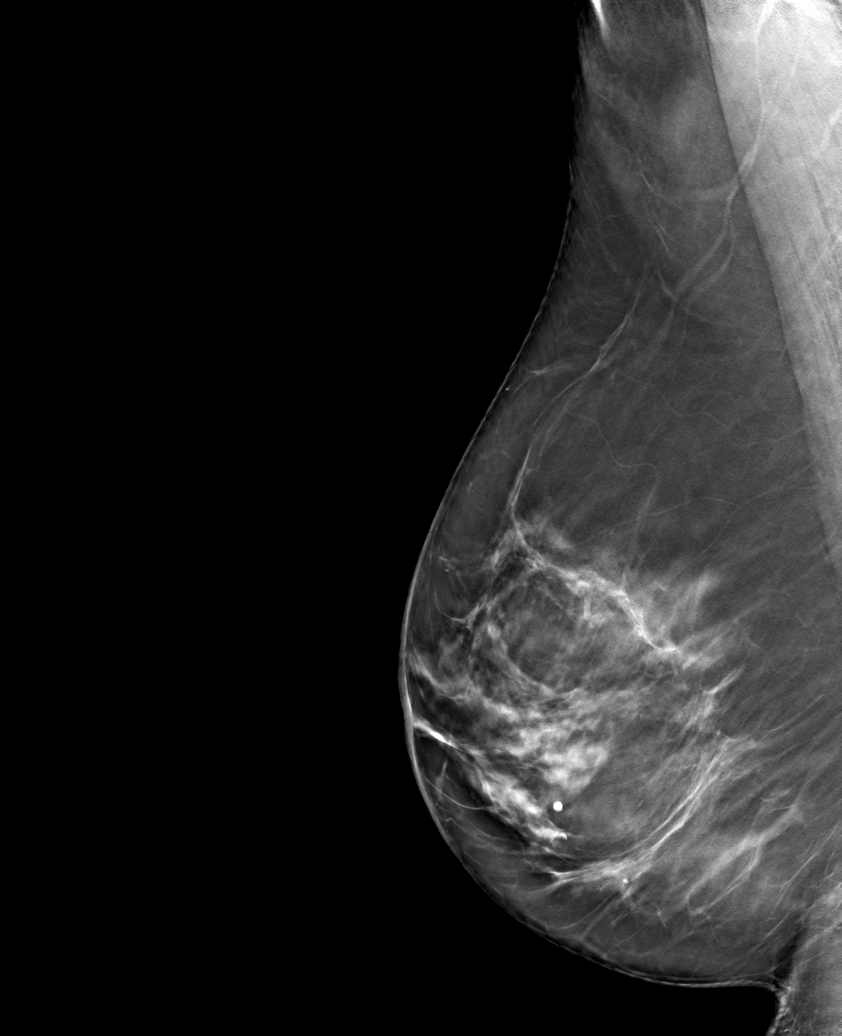

[R CC tomo (2 of 2) · tomo slice 30/59.0]
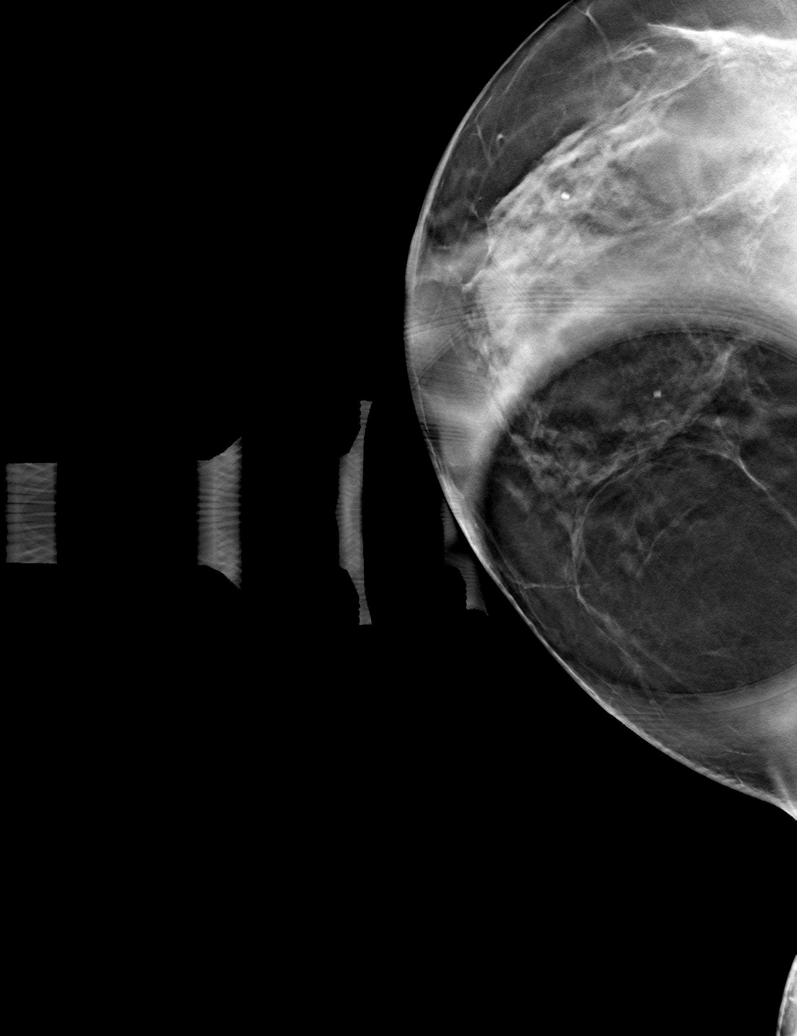

[6 of 18 positions shown; findings below may reference images not displayed]

ACR Breast Density Category c: The breast tissue is heterogeneously
dense, which may obscure small masses.
FINDINGS: Additional 2-D and 3-D images are performed. These views show no
persistent distortion or asymmetry in the MEDIAL aspect of the RIGHT
breast. No suspicious mass, distortion, or microcalcifications are
identified to suggest presence of malignancy.
IMPRESSION: No mammographic evidence for malignancy.

RECOMMENDATION:
Screening mammogram in one year.(Code:82-T-347)

I have discussed the findings and recommendations with the patient.
If applicable, a reminder letter will be sent to the patient
regarding the next appointment.

BI-RADS CATEGORY  1: Negative.

ADDENDUM:
Please note the following correction:

Patient returns after screening study for evaluation possible RIGHT
breast distortion.

*** End of Addendum ***
ACR Breast Density Category c: The breast tissue is heterogeneously
dense, which may obscure small masses.
FINDINGS: Additional 2-D and 3-D images are performed. These views show no
persistent distortion or asymmetry in the MEDIAL aspect of the RIGHT
breast. No suspicious mass, distortion, or microcalcifications are
identified to suggest presence of malignancy.
IMPRESSION: No mammographic evidence for malignancy.

RECOMMENDATION:
Screening mammogram in one year.(Code:82-T-347)

I have discussed the findings and recommendations with the patient.
If applicable, a reminder letter will be sent to the patient
regarding the next appointment.

BI-RADS CATEGORY  1: Negative.

## 2023-04-04 DIAGNOSIS — H6063 Unspecified chronic otitis externa, bilateral: Secondary | ICD-10-CM | POA: Diagnosis not present

## 2023-04-18 ENCOUNTER — Other Ambulatory Visit: Payer: Self-pay | Admitting: Nurse Practitioner

## 2023-04-18 DIAGNOSIS — H5202 Hypermetropia, left eye: Secondary | ICD-10-CM | POA: Diagnosis not present

## 2023-04-18 DIAGNOSIS — E039 Hypothyroidism, unspecified: Secondary | ICD-10-CM

## 2023-04-18 DIAGNOSIS — H52222 Regular astigmatism, left eye: Secondary | ICD-10-CM | POA: Diagnosis not present

## 2023-04-18 DIAGNOSIS — H524 Presbyopia: Secondary | ICD-10-CM | POA: Diagnosis not present

## 2023-06-10 ENCOUNTER — Ambulatory Visit: Payer: Medicare Other | Admitting: Podiatry

## 2023-06-10 ENCOUNTER — Encounter: Payer: Self-pay | Admitting: Podiatry

## 2023-06-10 DIAGNOSIS — D2372 Other benign neoplasm of skin of left lower limb, including hip: Secondary | ICD-10-CM | POA: Diagnosis not present

## 2023-06-10 DIAGNOSIS — L84 Corns and callosities: Secondary | ICD-10-CM

## 2023-06-10 DIAGNOSIS — M216X2 Other acquired deformities of left foot: Secondary | ICD-10-CM

## 2023-06-10 NOTE — Progress Notes (Signed)
Subjective:  Patient ID: [name], female    DOB: 1956/06/15,   MRN: [id number]  Chief Complaint  Patient presents with   Callouses    Pt presents for callouses on the bottom of her feet the left one is worse.    67 y.o. female presents for concern of callus on bottom of left foot. Relates last time it was ok for about three months and returned. Has been managing herself. Hoping to have it treated today.  . Denies any other pedal complaints. Denies n/v/f/c.   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 Hx of BLACK WIDOW SPIDER BITE x 2 left shoulder 10/11 with  "knot at base of scull"   seen by PCP- no neuro visit- resolved   Hepatitis    ? year- HEPATITIS C   Hypoglycemia    Hypothyroidism    Osteoporosis     Objective:  Physical Exam: Vascular: DP/PT pulses 2/4 bilateral. CFT <3 seconds. Normal hair growth on digits. No edema.  Skin. No lacerations or abrasions bilateral feet. Hyperkeratotic tissue noted sub second metatarsal on left and medial left hallux. Sub second metatarsal lesion cored with disruption of skin lines.  Musculoskeletal: MMT 5/5 bilateral lower extremities in DF, PF, Inversion and Eversion. Deceased ROM in DF of ankle joint.  Neurological: Sensation intact to light touch.   Assessment:   1. Benign neoplasm of skin of foot, left   2. 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 Corns and callosities       Plan:  Patient was evaluated and treated and all questions answered. -Discussed benign skin lesions.  with patient and treatment options.  -Hyperkeratotic tissue was debrided with chisel without incident.  -Applied salycylic acid treatment to  area with dressing. Advised to remove bandaging tomorrow.  -Encouraged daily moisturizing -Discussed use of pumice stone -Advised good supportive shoes and inserts. Discussed possible CMO in the future and information given.  -Patient to return to office as needed or sooner if condition worsens.   Louann Sjogren, DPM

## 2023-07-23 DIAGNOSIS — M5413 Radiculopathy, cervicothoracic region: Secondary | ICD-10-CM | POA: Diagnosis not present

## 2023-07-23 DIAGNOSIS — M546 Pain in thoracic spine: Secondary | ICD-10-CM | POA: Diagnosis not present

## 2023-07-23 DIAGNOSIS — M5417 Radiculopathy, lumbosacral region: Secondary | ICD-10-CM | POA: Diagnosis not present

## 2023-07-23 DIAGNOSIS — M5415 Radiculopathy, thoracolumbar region: Secondary | ICD-10-CM | POA: Diagnosis not present

## 2023-07-23 DIAGNOSIS — M5412 Radiculopathy, cervical region: Secondary | ICD-10-CM | POA: Diagnosis not present

## 2023-07-23 DIAGNOSIS — M7912 Myalgia of auxiliary muscles, head and neck: Secondary | ICD-10-CM | POA: Diagnosis not present

## 2023-07-23 DIAGNOSIS — R51 Headache with orthostatic component, not elsewhere classified: Secondary | ICD-10-CM | POA: Diagnosis not present

## 2023-07-23 DIAGNOSIS — M62830 Muscle spasm of back: Secondary | ICD-10-CM | POA: Diagnosis not present

## 2023-07-23 DIAGNOSIS — M5414 Radiculopathy, thoracic region: Secondary | ICD-10-CM | POA: Diagnosis not present

## 2023-07-24 ENCOUNTER — Encounter: Payer: Self-pay | Admitting: Family Medicine

## 2023-08-02 DIAGNOSIS — M5412 Radiculopathy, cervical region: Secondary | ICD-10-CM | POA: Diagnosis not present

## 2023-08-02 DIAGNOSIS — M5414 Radiculopathy, thoracic region: Secondary | ICD-10-CM | POA: Diagnosis not present

## 2023-08-02 DIAGNOSIS — M7912 Myalgia of auxiliary muscles, head and neck: Secondary | ICD-10-CM | POA: Diagnosis not present

## 2023-08-02 DIAGNOSIS — M5415 Radiculopathy, thoracolumbar region: Secondary | ICD-10-CM | POA: Diagnosis not present

## 2023-08-02 DIAGNOSIS — M5413 Radiculopathy, cervicothoracic region: Secondary | ICD-10-CM | POA: Diagnosis not present

## 2023-08-02 DIAGNOSIS — M5417 Radiculopathy, lumbosacral region: Secondary | ICD-10-CM | POA: Diagnosis not present

## 2023-08-02 DIAGNOSIS — M546 Pain in thoracic spine: Secondary | ICD-10-CM | POA: Diagnosis not present

## 2023-08-02 DIAGNOSIS — M62830 Muscle spasm of back: Secondary | ICD-10-CM | POA: Diagnosis not present

## 2023-08-02 DIAGNOSIS — R51 Headache with orthostatic component, not elsewhere classified: Secondary | ICD-10-CM | POA: Diagnosis not present

## 2023-08-09 DIAGNOSIS — M7912 Myalgia of auxiliary muscles, head and neck: Secondary | ICD-10-CM | POA: Diagnosis not present

## 2023-08-09 DIAGNOSIS — M5413 Radiculopathy, cervicothoracic region: Secondary | ICD-10-CM | POA: Diagnosis not present

## 2023-08-09 DIAGNOSIS — M5414 Radiculopathy, thoracic region: Secondary | ICD-10-CM | POA: Diagnosis not present

## 2023-08-09 DIAGNOSIS — M5417 Radiculopathy, lumbosacral region: Secondary | ICD-10-CM | POA: Diagnosis not present

## 2023-08-09 DIAGNOSIS — M5415 Radiculopathy, thoracolumbar region: Secondary | ICD-10-CM | POA: Diagnosis not present

## 2023-08-09 DIAGNOSIS — R51 Headache with orthostatic component, not elsewhere classified: Secondary | ICD-10-CM | POA: Diagnosis not present

## 2023-08-09 DIAGNOSIS — M62830 Muscle spasm of back: Secondary | ICD-10-CM | POA: Diagnosis not present

## 2023-08-09 DIAGNOSIS — M546 Pain in thoracic spine: Secondary | ICD-10-CM | POA: Diagnosis not present

## 2023-08-09 DIAGNOSIS — M5412 Radiculopathy, cervical region: Secondary | ICD-10-CM | POA: Diagnosis not present

## 2023-08-16 DIAGNOSIS — M62830 Muscle spasm of back: Secondary | ICD-10-CM | POA: Diagnosis not present

## 2023-08-16 DIAGNOSIS — M546 Pain in thoracic spine: Secondary | ICD-10-CM | POA: Diagnosis not present

## 2023-08-16 DIAGNOSIS — M5415 Radiculopathy, thoracolumbar region: Secondary | ICD-10-CM | POA: Diagnosis not present

## 2023-08-16 DIAGNOSIS — M5414 Radiculopathy, thoracic region: Secondary | ICD-10-CM | POA: Diagnosis not present

## 2023-08-16 DIAGNOSIS — M5417 Radiculopathy, lumbosacral region: Secondary | ICD-10-CM | POA: Diagnosis not present

## 2023-08-16 DIAGNOSIS — R51 Headache with orthostatic component, not elsewhere classified: Secondary | ICD-10-CM | POA: Diagnosis not present

## 2023-08-16 DIAGNOSIS — M5413 Radiculopathy, cervicothoracic region: Secondary | ICD-10-CM | POA: Diagnosis not present

## 2023-08-16 DIAGNOSIS — M7912 Myalgia of auxiliary muscles, head and neck: Secondary | ICD-10-CM | POA: Diagnosis not present

## 2023-08-16 DIAGNOSIS — M5412 Radiculopathy, cervical region: Secondary | ICD-10-CM | POA: Diagnosis not present

## 2023-08-29 DIAGNOSIS — L408 Other psoriasis: Secondary | ICD-10-CM | POA: Diagnosis not present

## 2023-08-29 DIAGNOSIS — L814 Other melanin hyperpigmentation: Secondary | ICD-10-CM | POA: Diagnosis not present

## 2023-08-29 DIAGNOSIS — D225 Melanocytic nevi of trunk: Secondary | ICD-10-CM | POA: Diagnosis not present

## 2023-08-29 DIAGNOSIS — L821 Other seborrheic keratosis: Secondary | ICD-10-CM | POA: Diagnosis not present

## 2023-09-02 ENCOUNTER — Ambulatory Visit
Admission: RE | Admit: 2023-09-02 | Discharge: 2023-09-02 | Disposition: A | Discharge status: Home / Self Care | Payer: Medicare Other | Source: Ambulatory Visit | Attending: Family Medicine

## 2023-09-02 DIAGNOSIS — Z780 Asymptomatic menopausal state: Secondary | ICD-10-CM

## 2023-09-02 DIAGNOSIS — M8588 Other specified disorders of bone density and structure, other site: Secondary | ICD-10-CM | POA: Diagnosis not present

## 2023-09-02 DIAGNOSIS — Z1231 Encounter for screening mammogram for malignant neoplasm of breast: Secondary | ICD-10-CM

## 2023-09-06 DIAGNOSIS — M7912 Myalgia of auxiliary muscles, head and neck: Secondary | ICD-10-CM | POA: Diagnosis not present

## 2023-09-06 DIAGNOSIS — M5417 Radiculopathy, lumbosacral region: Secondary | ICD-10-CM | POA: Diagnosis not present

## 2023-09-06 DIAGNOSIS — M546 Pain in thoracic spine: Secondary | ICD-10-CM | POA: Diagnosis not present

## 2023-09-06 DIAGNOSIS — R51 Headache with orthostatic component, not elsewhere classified: Secondary | ICD-10-CM | POA: Diagnosis not present

## 2023-09-06 DIAGNOSIS — M5412 Radiculopathy, cervical region: Secondary | ICD-10-CM | POA: Diagnosis not present

## 2023-09-06 DIAGNOSIS — M5415 Radiculopathy, thoracolumbar region: Secondary | ICD-10-CM | POA: Diagnosis not present

## 2023-09-06 DIAGNOSIS — M62830 Muscle spasm of back: Secondary | ICD-10-CM | POA: Diagnosis not present

## 2023-09-06 DIAGNOSIS — M5413 Radiculopathy, cervicothoracic region: Secondary | ICD-10-CM | POA: Diagnosis not present

## 2023-09-06 DIAGNOSIS — M5414 Radiculopathy, thoracic region: Secondary | ICD-10-CM | POA: Diagnosis not present

## 2023-09-13 DIAGNOSIS — M5412 Radiculopathy, cervical region: Secondary | ICD-10-CM | POA: Diagnosis not present

## 2023-09-13 DIAGNOSIS — M546 Pain in thoracic spine: Secondary | ICD-10-CM | POA: Diagnosis not present

## 2023-09-13 DIAGNOSIS — R51 Headache with orthostatic component, not elsewhere classified: Secondary | ICD-10-CM | POA: Diagnosis not present

## 2023-09-13 DIAGNOSIS — M5413 Radiculopathy, cervicothoracic region: Secondary | ICD-10-CM | POA: Diagnosis not present

## 2023-09-13 DIAGNOSIS — M7912 Myalgia of auxiliary muscles, head and neck: Secondary | ICD-10-CM | POA: Diagnosis not present

## 2023-09-13 DIAGNOSIS — M5415 Radiculopathy, thoracolumbar region: Secondary | ICD-10-CM | POA: Diagnosis not present

## 2023-09-13 DIAGNOSIS — M62830 Muscle spasm of back: Secondary | ICD-10-CM | POA: Diagnosis not present

## 2023-09-13 DIAGNOSIS — M5414 Radiculopathy, thoracic region: Secondary | ICD-10-CM | POA: Diagnosis not present

## 2023-09-13 DIAGNOSIS — M5417 Radiculopathy, lumbosacral region: Secondary | ICD-10-CM | POA: Diagnosis not present

## 2023-09-20 DIAGNOSIS — M7912 Myalgia of auxiliary muscles, head and neck: Secondary | ICD-10-CM | POA: Diagnosis not present

## 2023-09-20 DIAGNOSIS — M5412 Radiculopathy, cervical region: Secondary | ICD-10-CM | POA: Diagnosis not present

## 2023-09-20 DIAGNOSIS — M546 Pain in thoracic spine: Secondary | ICD-10-CM | POA: Diagnosis not present

## 2023-09-20 DIAGNOSIS — M62830 Muscle spasm of back: Secondary | ICD-10-CM | POA: Diagnosis not present

## 2023-09-20 DIAGNOSIS — M5414 Radiculopathy, thoracic region: Secondary | ICD-10-CM | POA: Diagnosis not present

## 2023-09-20 DIAGNOSIS — M5413 Radiculopathy, cervicothoracic region: Secondary | ICD-10-CM | POA: Diagnosis not present

## 2023-09-20 DIAGNOSIS — R51 Headache with orthostatic component, not elsewhere classified: Secondary | ICD-10-CM | POA: Diagnosis not present

## 2023-09-20 DIAGNOSIS — M5415 Radiculopathy, thoracolumbar region: Secondary | ICD-10-CM | POA: Diagnosis not present

## 2023-09-20 DIAGNOSIS — M5417 Radiculopathy, lumbosacral region: Secondary | ICD-10-CM | POA: Diagnosis not present

## 2023-10-14 ENCOUNTER — Other Ambulatory Visit: Payer: Self-pay | Admitting: Family Medicine

## 2023-10-14 DIAGNOSIS — E039 Hypothyroidism, unspecified: Secondary | ICD-10-CM

## 2023-10-21 ENCOUNTER — Encounter: Payer: Self-pay | Admitting: Podiatry

## 2023-10-21 ENCOUNTER — Ambulatory Visit: Payer: Medicare Other | Admitting: Podiatry

## 2023-10-21 DIAGNOSIS — M216X2 Other acquired deformities of left foot: Secondary | ICD-10-CM

## 2023-10-21 DIAGNOSIS — D2372 Other benign neoplasm of skin of left lower limb, including hip: Secondary | ICD-10-CM

## 2023-10-21 NOTE — Progress Notes (Signed)
Patient was here and inquiring about foot orthotics I explained oop as she has [facility] we then talked about powersteps  Patient will try these 1st also patient has HX of BIL THA's and right leg is approx 1/4" shorter I made her heel lift to use under right orthotic to see If lower back pain is reduced  Patient will call if any problems arise and or if she decides she would like to move forward with CFO's  Addison Bailey CPed, CFo, CFm

## 2023-10-21 NOTE — Progress Notes (Signed)
  Subjective:  Patient ID: [name], female    DOB: 01-16-1956,   MRN: [id number]  No chief complaint on file.   68 y.o. female presents for concern of callus on bottom of left foot. Relates has returned and lasted about three months.  Has been managing herself. Hoping to have it treated today. Wondering about orthotics as well.   . Denies any other pedal complaints. Denies n/v/f/c.   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 Hx of BLACK WIDOW SPIDER BITE x 2 left shoulder 10/11 with  "knot at base of scull"   seen by PCP- no neuro visit- resolved   Hepatitis    ? year- HEPATITIS C   Hypoglycemia    Hypothyroidism    Osteoporosis     Objective:  Physical Exam: Vascular: DP/PT pulses 2/4 bilateral. CFT <3 seconds. Normal hair growth on digits. No edema.  Skin. No lacerations or abrasions bilateral feet. Hyperkeratotic tissue noted sub second metatarsal on left and medial left hallux. Sub second metatarsal lesion cored with disruption of skin lines.  Musculoskeletal: MMT 5/5 bilateral lower extremities in DF, PF, Inversion and Eversion. Deceased ROM in DF of ankle joint.  Neurological: Sensation intact to light touch.   Assessment:   1. 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 Benign neoplasm of skin of foot, left        Plan:  Patient was evaluated and treated and all questions answered. -Discussed benign skin lesions.  with patient and treatment options.  -Hyperkeratotic tissue was debrided with chisel without incident.  -Applied salycylic acid treatment to area with dressing. Advised to remove bandaging tomorrow.  -Encouraged daily moisturizing -Discussed use of  pumice stone -Advised good supportive shoes and inserts.  -Fitted for orthotics today. Patient will be called when we get them in to pick up.  -Patient to return to office as needed or sooner if condition worsens.   Louann Sjogren, DPM

## 2023-10-31 ENCOUNTER — Encounter: Payer: Self-pay | Admitting: Family Medicine

## 2023-11-07 ENCOUNTER — Other Ambulatory Visit: Payer: Medicare Other

## 2023-11-07 ENCOUNTER — Ambulatory Visit: Payer: Medicare Other | Admitting: Family Medicine

## 2023-11-07 DIAGNOSIS — Z131 Encounter for screening for diabetes mellitus: Secondary | ICD-10-CM | POA: Diagnosis not present

## 2023-11-07 DIAGNOSIS — E039 Hypothyroidism, unspecified: Secondary | ICD-10-CM

## 2023-11-07 DIAGNOSIS — E559 Vitamin D deficiency, unspecified: Secondary | ICD-10-CM

## 2023-11-07 DIAGNOSIS — E782 Mixed hyperlipidemia: Secondary | ICD-10-CM

## 2023-11-08 LAB — CBC WITH DIFFERENTIAL/PLATELET
Basophils Absolute: 0.1 10*3/uL (ref 0.0–0.2)
Basos: 1 %
EOS (ABSOLUTE): 0.2 10*3/uL (ref 0.0–0.4)
Eos: 3 %
Hematocrit: 45.1 % (ref 34.0–46.6)
Hemoglobin: 15.2 g/dL (ref 11.1–15.9)
Immature Grans (Abs): 0 10*3/uL (ref 0.0–0.1)
Immature Granulocytes: 0 %
Lymphocytes Absolute: 2.6 10*3/uL (ref 0.7–3.1)
Lymphs: 43 %
MCH: 30.8 pg (ref 26.6–33.0)
MCHC: 33.7 g/dL (ref 31.5–35.7)
MCV: 92 fL (ref 79–97)
Monocytes Absolute: 0.6 10*3/uL (ref 0.1–0.9)
Monocytes: 9 %
Neutrophils Absolute: 2.7 10*3/uL (ref 1.4–7.0)
Neutrophils: 44 %
Platelets: 278 10*3/uL (ref 150–450)
RBC: 4.93 x10E6/uL (ref 3.77–5.28)
RDW: 12.7 % (ref 11.7–15.4)
WBC: 6.1 10*3/uL (ref 3.4–10.8)

## 2023-11-08 LAB — COMPREHENSIVE METABOLIC PANEL
ALT: 19 [IU]/L (ref 0–32)
AST: 19 [IU]/L (ref 0–40)
Albumin: 4.5 g/dL (ref 3.9–4.9)
Alkaline Phosphatase: 71 [IU]/L (ref 44–121)
BUN/Creatinine Ratio: 20 (ref 12–28)
BUN: 16 mg/dL (ref 8–27)
Bilirubin Total: 0.7 mg/dL (ref 0.0–1.2)
CO2: 24 mmol/L (ref 20–29)
Calcium: 9.9 mg/dL (ref 8.7–10.3)
Chloride: 104 mmol/L (ref 96–106)
Creatinine, Ser: 0.81 mg/dL (ref 0.57–1.00)
Globulin, Total: 2.6 g/dL (ref 1.5–4.5)
Glucose: 85 mg/dL (ref 70–99)
Potassium: 4.7 mmol/L (ref 3.5–5.2)
Sodium: 141 mmol/L (ref 134–144)
Total Protein: 7.1 g/dL (ref 6.0–8.5)
eGFR: 80 mL/min/{1.73_m2} (ref >59)

## 2023-11-08 LAB — TSH RFX ON ABNORMAL TO FREE T4: TSH: 3.77 u[IU]/mL (ref 0.450–4.500)

## 2023-11-08 LAB — LIPID PANEL
Chol/HDL Ratio: 3.7 {ratio} (ref 0.0–4.4)
Cholesterol, Total: 187 mg/dL (ref 100–199)
HDL: 50 mg/dL (ref >39)
LDL Chol Calc (NIH): 113 mg/dL — ABNORMAL HIGH (ref 0–99)
Triglycerides: 137 mg/dL (ref 0–149)
VLDL Cholesterol Cal: 24 mg/dL (ref 5–40)

## 2023-11-08 LAB — HEMOGLOBIN A1C
Est. average glucose Bld gHb Est-mCnc: 120 mg/dL
Hgb A1c MFr Bld: 5.8 % — ABNORMAL HIGH (ref 4.8–5.6)

## 2023-11-08 LAB — VITAMIN D 25 HYDROXY (VIT D DEFICIENCY, FRACTURES): Vit D, 25-Hydroxy: 20.7 ng/mL — ABNORMAL LOW (ref 30.0–100.0)

## 2023-11-12 ENCOUNTER — Encounter: Payer: Self-pay | Admitting: Family Medicine

## 2023-11-12 ENCOUNTER — Ambulatory Visit (INDEPENDENT_AMBULATORY_CARE_PROVIDER_SITE_OTHER): Payer: Medicare Other | Admitting: Family Medicine

## 2023-11-12 VITALS — BP 122/83 | HR 70 | Ht 69.0 in | Wt 188.8 lb

## 2023-11-12 DIAGNOSIS — E559 Vitamin D deficiency, unspecified: Secondary | ICD-10-CM | POA: Diagnosis not present

## 2023-11-12 DIAGNOSIS — E039 Hypothyroidism, unspecified: Secondary | ICD-10-CM | POA: Diagnosis not present

## 2023-11-12 DIAGNOSIS — I6523 Occlusion and stenosis of bilateral carotid arteries: Secondary | ICD-10-CM | POA: Diagnosis not present

## 2023-11-12 DIAGNOSIS — Z0000 Encounter for general adult medical examination without abnormal findings: Secondary | ICD-10-CM

## 2023-11-12 DIAGNOSIS — E782 Mixed hyperlipidemia: Secondary | ICD-10-CM | POA: Diagnosis not present

## 2023-11-12 MED ORDER — VITAMIN D (ERGOCALCIFEROL) 1.25 MG (50000 UNIT) PO CAPS
50000.0000 [IU] | ORAL_CAPSULE | ORAL | 0 refills | Status: AC
Start: 2023-11-12 — End: ?

## 2023-11-12 NOTE — Assessment & Plan Note (Signed)
 Stable, TSH within normal limits.  Continue levothyroxine 100 mcg daily in the morning.  Will continue to monitor.

## 2023-11-12 NOTE — Assessment & Plan Note (Signed)
 Start ergocalciferol for 3 months, then switch back to OTC vitamin D supplement which will need to be a long term supplement.

## 2023-11-12 NOTE — Assessment & Plan Note (Signed)
 Last lipid panel: LDL 113, HDL 50, triglycerides 137.  Stable.  Continue with nutrition and lifestyle interventions.  Will continue to monitor.

## 2023-11-12 NOTE — Patient Instructions (Addendum)
 Your vitamin D is low, so we will start a weekly prescription strength vitamin D supplement for 3 months to increase vitamin D levels to the normal range.  After that, you can switch to an over-the-counter vitamin D3 2000 unit daily supplement to maintain vitamin D.   PREVENTATIVE CARE: I encourage you to consider the following preventative care options that are recommended for you.  These are covered by your insurance company because they are for the prevention of issues that can be dangerous to your health. -Shingles vaccine: 2 dose series recommended for all adults starting at age 44. -Pneumonia (PCV20) vaccine: Recommended for all adults starting at age 25 to reduce the likelihood of getting pneumonia, having complications, or needing to go to the hospital.  It may be recommended prior to age 42 for people with a weaker immune system due to medications, conditions like diabetes, lung disease, tobacco use, etc. -RSV

## 2023-11-12 NOTE — Progress Notes (Signed)
 Complete physical exam  Patient: [name]   DOB: 05-27-1956   68 y.o. Female  MRN: [id number]  Subjective:    Chief Complaint  Patient presents with   Annual Exam    [name] is a 68 y.o. female who presents today for a complete physical exam. She reports consuming a general diet.  She walks a mile every day with her dog, participates in water aerobics, and stays active with yardwork.  She generally feels well. She does note that she had a cervical xray done by her chiropractor, and he noted that there was "calcification of the blood vessels that could go to her brain if they broke off". This is concerning to her. Denies dizziness, headache, changes in vision.   Most recent fall risk assessment:    11/12/2023    1:59 PM  Fall Risk   Falls in the past year? 0  Number falls in past yr: 0  Injury with Fall? 0  Risk for fall due to : No Fall Risks  Follow up Falls evaluation completed     Most recent depression and anxiety screenings:    11/12/2023    2:00 PM 12/18/2022    2:34 PM  PHQ 2/9 Scores  PHQ - 2 Score 2 1  PHQ- 9 Score 8 8      11/12/2023    2:00 PM 12/18/2022    2:34 PM 07/27/2022   10:50 AM 07/19/2022    1:35 PM  GAD 7 : Generalized Anxiety Score  Nervous, Anxious, on Edge 1 1 1 1   Control/stop worrying 0 1 1 0  Worry too much - different things 1 1 1 1   Trouble relaxing 1 1 1 1   Restless 0 0 0 0  Easily annoyed or irritable 1 1 1 1   Afraid - awful might happen 0 0 0 0  Total GAD 7 Score 4 5 5 4   Anxiety Difficulty Somewhat difficult Somewhat difficult Not difficult at all     Patient Active Problem List   Diagnosis Date Noted   Osteopenia after menopause 09/21/2021   Osteoarthritis of right hip 04/27/2021   Trochanteric bursitis of right hip 04/27/2021   Lumbar radiculopathy 03/22/2021   Seasonal allergic rhinitis due to pollen 02/08/2021   Degeneration of lumbar intervertebral disc 12/05/2017   Weakness of both lower extremities  09/05/2017   Vitamin D deficiency 07/01/2016   Hypercholesterolemia with hypertriglyceridemia 06/26/2016   Hypothyroidism 06/04/2016   History of revision of total replacement of left hip joint 06/04/2016   Insomnia 06/04/2016    Past Surgical History:  Procedure Laterality Date   CERVICAL CONE BIOPSY     COLONOSCOPY     EYE SURGERY     Lasic- left eye   JOINT REPLACEMENT N/A    [employer]reesia 06/14/2020   laser conization     TONSILLECTOMY     TOTAL HIP ARTHROPLASTY  10/30/2011   Procedure: TOTAL HIP ARTHROPLASTY;  Surgeon: Drucilla Schmidt, MD;  Location: [facility];  Service: Orthopedics;  Laterality: Left;   Social History   Tobacco Use   Smoking status: Former    Current packs/day: 0.00    Average packs/day: 0.5 packs/day for 38.0 years (19.0 ttl pk-yrs)    Types: Cigarettes    Start date: 03/24/1978    Quit date: 03/24/2016    Years since quitting: 7.6    Passive exposure: Never   Smokeless tobacco: Never   Tobacco comments:    5-6 cigarettes day  Vaping Use  Vaping status: Never Used  Substance Use Topics   Alcohol use: Yes    Comment: occ   Drug use: No    Comment: marijuana/cocaine use in college   Family History  Adopted: Yes   No Known Allergies   Patient Care Team: Melida Quitter, PA as PCP - General (Family Medicine) A[place]ton, Illene Labrador, MD (Inactive) as Consulting Physician (Orthopedic Surgery) Durene Romans, MD as Consulting Physician (Orthopedic Surgery) Willis Modena, MD as Consulting Physician (Gastroenterology) Cherlyn Roberts, MD as Consulting Physician (Dermatology) Asencion Islam, DPM as Consulting Physician (Podiatry)   Outpatient Medications Prior to Visit  Medication Sig   Calcium-Magnesium-Vitamin D 300-150-400 MG-MG-UNIT TABS Take 1 tablet by mouth daily.   fluocinonide (LIDEX) 0.05 % external solution Apply 0.05 application topically 2 (two) times daily as needed.   Probiotic Product (PROBIOTIC FORMULA PO) Take 1 capsule by mouth daily.    SYNTHROID 100 MCG tablet TAKE 1 TABLET BY MOUTH ONCE DAILY BEFORE BREAKFAST   [DISCONTINUED] Calcium Citrate-Vitamin D (CALCIUM CITRATE + PO) Take 1 tablet by mouth daily.   [DISCONTINUED] desonide (DESOWEN) 0.05 % cream Apply topically 2 (two) times daily.   [DISCONTINUED] Glucosamine-Chondroit-Vit C-Mn (GLUCOSAMINE 1500 COMPLEX PO) Take by mouth.   [DISCONTINUED] magnesium 30 MG tablet Take 30 mg by mouth daily.   No facility-administered medications prior to visit.    Review of Systems  Constitutional:  Negative for chills, fever and malaise/fatigue.  HENT:  Negative for congestion and hearing loss.   Eyes:  Negative for blurred vision and double vision.  Respiratory:  Negative for cough and shortness of breath.   Cardiovascular:  Negative for chest pain, palpitations and leg swelling.  Gastrointestinal:  Negative for abdominal pain, constipation, diarrhea and heartburn.  Genitourinary:  Negative for frequency and urgency.  Musculoskeletal:  Negative for myalgias and neck pain.  Neurological:  Negative for headaches.  Endo/Heme/Allergies:  Negative for polydipsia.  Psychiatric/Behavioral:  Negative for depression. The patient is not nervous/anxious.       Objective:    BP 122/83   Pulse 70   Ht 5\' 9"  (1.753 m)   Wt 188 lb 12 oz (85.6 kg)   LMP 10/29/2000   SpO2 97%   BMI 27.87 kg/m    Physical Exam Constitutional:      General: She is not in acute distress.    Appearance: Normal appearance.  HENT:     Head: Normocephalic and atraumatic.     Right Ear: Tympanic membrane, ear canal and external ear normal.     Left Ear: Tympanic membrane, ear canal and external ear normal.     Nose: Nose normal.     Mouth/Throat:     Mouth: Mucous membranes are moist.     Pharynx: No oropharyngeal exudate or posterior oropharyngeal erythema.  Eyes:     Extraocular Movements: Extraocular movements intact.     Conjunctiva/sclera: Conjunctivae normal.     Pupils: Pupils are equal,  round, and reactive to light.  Neck:     Thyroid: No thyroid mass, thyromegaly or thyroid tenderness.  Cardiovascular:     Rate and Rhythm: Normal rate and regular rhythm.     Heart sounds: Normal heart sounds. No murmur heard.    No friction rub. No gallop.  Pulmonary:     Effort: Pulmonary effort is normal. No respiratory distress.     Breath sounds: Normal breath sounds. No wheezing, rhonchi or rales.  Abdominal:     General: Abdomen is flat. Bowel sounds are normal.  There is no distension.     Palpations: There is no mass.     Tenderness: There is no abdominal tenderness. There is no guarding.  Musculoskeletal:        General: Normal range of motion.     Cervical back: Normal range of motion and neck supple.  Lymphadenopathy:     Cervical: No cervical adenopathy.  Skin:    General: Skin is warm and dry.  Neurological:     Mental Status: She is alert and oriented to person, place, and time.     Cranial Nerves: No cranial nerve deficit.     Motor: No weakness.     Deep Tendon Reflexes: Reflexes normal.  Psychiatric:        Mood and Affect: Mood normal.        Assessment & Plan:    Routine Health Maintenance and Physical Exam  Immunization History  Administered Date(s) Administered   Hepatitis A, Adult 06/01/2021, 03/21/2022   Hepatitis B, ADULT 06/01/2021, 07/01/2021   Influenza-Unspecified 06/01/2021   PFIZER(Purple Top)SARS-COV-2 Vaccination 11/05/2019, 11/28/2019   Pfizer Covid-19 Vaccine Bivalent Booster 97yrs & up 06/19/2020, 04/12/2021   Tdap 09/24/2005, 01/07/2017    Health Maintenance  Topic Date Due   Zoster Vaccines- Shingrix (1 of 2) Never done   Medicare Annual Wellness (AWV)  12/18/2023   COVID-19 Vaccine (5 - 2024-25 season) 11/28/2023 (Originally 05/26/2023)   Pneumonia Vaccine 23+ Years old (1 of 1 - PCV) 12/18/2023 (Originally 10/18/2021)   INFLUENZA VACCINE  12/23/2023 (Originally 04/25/2023)   Colonoscopy  10/25/2024   MAMMOGRAM  09/01/2025    DTaP/Tdap/Td (3 - Td or Tdap) 01/08/2027   DEXA SCAN  Completed   Hepatitis C Screening  Completed   HPV VACCINES  Aged Out    Reviewed most recent labs including CBC, CMP, lipid panel, A1C, TSH, and vitamin D. All within normal limits/stable from last check other than LDL 113 and vitamin D 20.7.  Discussed health benefits of physical activity, and encouraged her to engage in regular exercise appropriate for her age and condition.  Wellness examination  Vitamin D deficiency Assessment & Plan: Start ergocalciferol for 3 months, then switch back to OTC vitamin D supplement which will need to be a long term supplement.  Orders: -     Vitamin D (Ergocalciferol); Take 1 capsule (50,000 Units total) by mouth every 7 (seven) days.  Dispense: 12 capsule; Refill: 0  Hypercholesterolemia with hypertriglyceridemia Assessment & Plan: Last lipid panel: LDL 113, HDL 50, triglycerides 137.  Stable.  Continue with nutrition and lifestyle interventions.  Will continue to monitor.   Hypothyroidism, unspecified type Assessment & Plan: Stable, TSH within normal limits.  Continue levothyroxine 100 mcg daily in the morning.  Will continue to monitor.   Calcification of both carotid arteries -     US Carotid Bilateral; Future  Ordering ultrasound of carotid arteries to evaluate for possible calcification.  Return in about 6 months (around 05/11/2024) for AWV; 1 year for annual physical, fasting labs 1 week before.     Melida Quitter, PA

## 2023-11-15 ENCOUNTER — Ambulatory Visit
Admission: RE | Admit: 2023-11-15 | Discharge: 2023-11-15 | Disposition: A | Discharge status: Home / Self Care | Payer: Medicare Other | Source: Ambulatory Visit | Attending: Family Medicine | Admitting: Family Medicine

## 2023-11-15 DIAGNOSIS — R0989 Other specified symptoms and signs involving the circulatory and respiratory systems: Secondary | ICD-10-CM | POA: Diagnosis not present

## 2023-11-15 DIAGNOSIS — I6523 Occlusion and stenosis of bilateral carotid arteries: Secondary | ICD-10-CM

## 2023-11-18 ENCOUNTER — Encounter: Payer: Self-pay | Admitting: Family Medicine

## 2023-12-05 DIAGNOSIS — M1611 Unilateral primary osteoarthritis, right hip: Secondary | ICD-10-CM | POA: Diagnosis not present

## 2023-12-26 ENCOUNTER — Ambulatory Visit: Payer: Medicare Other

## 2023-12-26 DIAGNOSIS — Z0000 Encounter for general adult medical examination without abnormal findings: Secondary | ICD-10-CM

## 2023-12-26 NOTE — Patient Instructions (Signed)
 [name] , Thank you for taking time to come for your Medicare Wellness Visit. I appreciate your ongoing commitment to your health goals. Please review the following plan we discussed and let me know if I can assist you in the future.   Referrals/Orders/Follow-Ups/Clinician Recommendations: none  This is a list of the screening recommended for you and due dates:  Health Maintenance  Topic Date Due   Zoster (Shingles) Vaccine (1 of 2) Never done   Pneumonia Vaccine (1 of 1 - PCV) Never done   COVID-19 Vaccine (6 - Pfizer risk 2024-25 season) 01/18/2024   Flu Shot  04/24/2024   Colon Cancer Screening  10/25/2024   Medicare Annual Wellness Visit  12/25/2024   Mammogram  09/01/2025   DTaP/Tdap/Td vaccine (3 - Td or Tdap) 01/08/2027   DEXA scan (bone density measurement)  Completed   Hepatitis C Screening  Completed   HPV Vaccine  Aged Out    Advanced directives: (ACP Link)Information on Advanced Care Planning can be found at [facility] [place] Advance Health Care Directives Advance Health Care Directives. [email or web address]   Next Medicare Annual Wellness Visit scheduled for next year: Yes  insert Preventive Care attachment Insert FALL PREVENTION attachment if needed

## 2023-12-26 NOTE — Progress Notes (Signed)
 Subjective:   [name] is a 68 y.o. who presents for a Medicare Wellness preventive visit.  Visit Complete: Virtual I connected with  [name] on 12/26/23 by a video and audio enabled telemedicine application and verified that I am speaking with the correct person using two identifiers.  Patient Location: Home  Provider Location: Home Office  I discussed the limitations of evaluation and management by telemedicine. The patient expressed understanding and agreed to proceed.  Vital Signs: Because this visit was a virtual/telehealth visit, some criteria may be missing or patient reported. Any vitals not documented were not able to be obtained and vitals that have been documented are patient reported.    Persons Participating in Visit: Patient.  AWV Questionnaire: No: Patient Medicare AWV questionnaire was not completed prior to this visit.  Cardiac Risk Factors include: advanced age (>37men, >38 women)     Objective:    Today's Vitals   There is no height or weight on file to calculate BMI.     12/26/2023   12:57 PM 06/04/2016   10:03 AM 10/30/2011   10:40 AM 10/23/2011    2:03 PM  Advanced Directives  Does Patient Have a Medical Advance Directive? No No Patient does not have advance directive Patient does not have advance directive  Would patient like information on creating a medical advance directive? No - Patient declined No - patient declined information      Current Medications (verified) Outpatient Encounter Medications as of 12/26/2023  Medication Sig   Calcium-Magnesium-Vitamin D 300-150-400 MG-MG-UNIT TABS Take 1 tablet by mouth daily.   fluocinonide (LIDEX) 0.05 % external solution Apply 0.05 application topically 2 (two) times daily as needed.   Probiotic Product (PROBIOTIC FORMULA PO) Take 1 capsule by mouth daily.   SYNTHROID 100 MCG tablet TAKE 1 TABLET BY MOUTH ONCE DAILY BEFORE BREAKFAST   Vitamin D, Ergocalciferol, (DRISDOL) 1.25 MG (50000  UNIT) CAPS capsule Take 1 capsule (50,000 Units total) by mouth every 7 (seven) days.   No facility-administered encounter medications on file as of 12/26/2023.    Allergies (verified) Patient has no known allergies.   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 Hx of BLACK WIDOW SPIDER BITE x 2 left shoulder 10/11 with  "knot at base of scull"   seen by PCP- no neuro visit- resolved   Hepatitis    ? year- HEPATITIS C   Hypoglycemia    Hypothyroidism    Osteoporosis    Past Surgical History:  Procedure Laterality Date   CERVICAL CONE BIOPSY     COLONOSCOPY     EYE SURGERY     Lasic- left eye   JOINT REPLACEMENT N/A    Phreesia 06/14/2020   laser conization     TONSILLECTOMY     TOTAL HIP ARTHROPLASTY  10/30/2011   Procedure: TOTAL HIP ARTHROPLASTY;  Surgeon: Drucilla Schmidt, MD;  Location: [facility];  Service: Orthopedics;  Laterality: Left;   Family History  Adopted: Yes   Social History   Socioeconomic History   Marital status: Married    Spouse name: Not on file  Number of children: Not on file   Years of education: Not on file   Highest education level: Master's degree (e.g., MA, MS, MEng, MEd, MSW, MBA)  Occupational History   Not on file  Tobacco Use   Smoking status: Former    Current packs/day: 0.00    Average packs/day: 0.5 packs/day for 38.0 years (19.0 ttl pk-yrs)    Types: Cigarettes    Start date: 03/24/1978    Quit date: 03/24/2016    Years since quitting: 7.7    Passive exposure: Never   Smokeless tobacco: Never   Tobacco comments:    5-6 cigarettes day  Vaping Use   Vaping status: Never Used  Substance and Sexual Activity   Alcohol use: Yes     Comment: once a month or so I have a couple of sips of beer or alcoho   Drug use: No    Comment: marijuana/cocaine use in college   Sexual activity: Not Currently    Partners: Male  Other Topics Concern   Not on file  Social History Narrative   Not on file   Social Drivers of Health   Financial Resource Strain: Low Risk  (12/26/2023)   Overall Financial Resource Strain (CARDIA)    Difficulty of Paying Living Expenses: Not hard at all  Recent Concern: Financial Resource Strain - Medium Risk (11/04/2023)   Overall Financial Resource Strain (CARDIA)    Difficulty of Paying Living Expenses: Somewhat hard  Food Insecurity: No Food Insecurity (12/26/2023)   Hunger Vital Sign    Worried About Running Out of Food in the Last Year: Never true    Ran Out of Food in the Last Year: Never true  Transportation Needs: No Transportation Needs (12/26/2023)   PRAPARE - Administrator, Civil Service (Medical): No    Lack of Transportation (Non-Medical): No  Physical Activity: Insufficiently Active (12/26/2023)   Exercise Vital Sign    Days of Exercise per Week: 5 days    Minutes of Exercise per Session: 20 min  Stress: Stress Concern Present (12/26/2023)   [employer] of Occupational Health - Occupational Stress Questionnaire    Feeling of Stress : To some extent  Social Connections: Moderately Integrated (12/26/2023)   Social Connection and Isolation Panel [NHANES]    Frequency of Communication with Friends and Family: More than three times a week    Frequency of Social Gatherings with Friends and Family: Once a week    Attends Religious Services: Never    Database administrator or Organizations: Yes    Attends Engineer, structural: More than 4 times per year    Marital Status: Married    Tobacco Counseling Counseling given: Not Answered Tobacco comments: 5-6 cigarettes day    Clinical Intake:  Pre-visit preparation completed: Yes  Pain : No/denies pain      Nutritional Risks: None Diabetes: No  Lab Results  Component Value Date   HGBA1C 5.8 (H) 11/07/2023   HGBA1C 5.7 (H) 07/26/2022   HGBA1C 5.9 (H) 12/07/2021     How often do you need to have someone help you when you read instructions, pamphlets, or other written materials from your doctor or pharmacy?: 1 - Never  Interpreter Needed?: No  Information entered by :: NAllen LPN   Activities of Daily Living     12/26/2023   12:52 PM  In your present state of health, do you have any difficulty performing the following activities:  Hearing? 0  Vision? 0  Difficulty concentrating or making decisions? 0  Walking or climbing stairs? 0  Dressing or bathing? 0  Doing errands, shopping? 0  Preparing Food and eating ? N  Using the Toilet? N  In the past six months, have you accidently leaked urine? N  Do you have problems with loss of bowel control? N  Managing your Medications? N  Managing your Finances? N  Housekeeping or managing your Housekeeping? N    Patient Care Team: Melida Quitter, PA as PCP - General (Family Medicine) [place], Illene Labrador, MD (Inactive) as Consulting Physician (Orthopedic Surgery) Durene Romans, MD as Consulting Physician (Orthopedic Surgery) Willis Modena, MD as Consulting Physician (Gastroenterology) Cherlyn Roberts, MD as Consulting Physician (Dermatology) Asencion Islam, DPM as Consulting Physician (Podiatry)  Indicate any recent Medical Services you may have received from other than [facility] providers in the past year (date may be approximate).     Assessment:   This is a routine wellness examination for [place].  Hearing/Vision screen Hearing Screening - Comments:: Denies hearing issues Vision Screening - Comments:: Regular eye exams,    Goals Addressed             This Visit's Progress    Patient Stated       12/26/2023, eat healthy and lose weight       Depression Screen     12/26/2023   12:59 PM 11/12/2023    2:00 PM  12/18/2022    2:34 PM 07/27/2022   10:50 AM 07/19/2022    1:34 PM 07/04/2022    2:04 PM 12/07/2021    9:49 AM  PHQ 2/9 Scores  PHQ - 2 Score 0 2 1 1 1 1  0  PHQ- 9 Score 6 8 8 6 5 5 5     Fall Risk     12/26/2023   12:58 PM 11/12/2023    1:59 PM 12/18/2022    2:34 PM 12/15/2022    8:02 PM 07/27/2022   10:51 AM  Fall Risk   Falls in the past year? 0 0 0 0 0  Number falls in past yr: 0 0 0 0 0  Injury with Fall? 0 0 0 0 0  Risk for fall due to : Medication side effect No Fall Risks No Fall Risks    Follow up Falls prevention discussed;Falls evaluation completed Falls evaluation completed       MEDICARE RISK AT HOME:  Medicare Risk at Home Any stairs in or around the home?: Yes If so, are there any without handrails?: No Home free of loose throw rugs in walkways, pet beds, electrical cords, etc?: Yes Adequate lighting in your home to reduce risk of falls?: Yes Life alert?: No Use of a cane, walker or w/c?: No Grab bars in the bathroom?: No Shower chair or bench in shower?: No Elevated toilet seat or a handicapped toilet?: Yes  TIMED UP AND GO:  Was the test performed?  No  Cognitive Function: 6CIT completed        12/26/2023    1:00 PM  6CIT Screen  What Year? 0 points  What month? 0 points  What time? 0 points  Count back from 20 0 points  Months in reverse 0 points  Repeat phrase 2 points  Total Score 2 points    Immunizations Immunization History  Administered Date(s) Administered   Hepatitis A, Adult 06/01/2021, 03/21/2022   Hepatitis B, ADULT 06/01/2021, 07/01/2021   Influenza-Unspecified 06/01/2021   PFIZER(Purple Top)SARS-COV-2 Vaccination 11/05/2019, 11/28/2019   Pfizer Covid-19  Vaccine Bivalent Booster 54yrs & up 06/19/2020, 04/12/2021   Pfizer(Comirnaty)Fall Seasonal Vaccine 12 years and older 07/20/2023   Tdap 09/24/2005, 01/07/2017    Screening Tests Health Maintenance  Topic Date Due   Zoster Vaccines- Shingrix (1 of 2) Never done   Pneumonia  Vaccine 54+ Years old (1 of 1 - PCV) Never done   COVID-19 Vaccine (6 - Pfizer risk 2024-25 season) 01/18/2024   INFLUENZA VACCINE  04/24/2024   Colonoscopy  10/25/2024   Medicare Annual Wellness (AWV)  12/25/2024   MAMMOGRAM  09/01/2025   DTaP/Tdap/Td (3 - Td or Tdap) 01/08/2027   DEXA SCAN  Completed   Hepatitis C Screening  Completed   HPV VACCINES  Aged Out    Health Maintenance  Health Maintenance Due  Topic Date Due   Zoster Vaccines- Shingrix (1 of 2) Never done   Pneumonia Vaccine 31+ Years old (1 of 1 - PCV) Never done   Health Maintenance Items Addressed: Due for shingrix and Pneumonia vaccines.  Additional Screening:  Vision Screening: Recommended annual ophthalmology exams for early detection of glaucoma and other disorders of the eye.  Dental Screening: Recommended annual dental exams for proper oral hygiene  Community Resource Referral / Chronic Care Management: CRR required this visit?  No   CCM required this visit?  No     Plan:     I have personally reviewed and noted the following in the patient's chart:   Medical and social history Use of alcohol, tobacco or illicit drugs  Current medications and supplements including opioid prescriptions. Patient is not currently taking opioid prescriptions. Functional ability and status Nutritional status Physical activity Advanced directives List of other physicians Hospitalizations, surgeries, and ER visits in previous 12 months Vitals Screenings to include cognitive, depression, and falls Referrals and appointments  In addition, I have reviewed and discussed with patient certain preventive protocols, quality metrics, and best practice recommendations. A written personalized care plan for preventive services as well as general preventive health recommendations were provided to patient.     Barb Merino, LPN   09/29/1094   After Visit Summary: (MyChart) Due to this being a telephonic visit, the after  visit summary with patients personalized plan was offered to patient via MyChart   Notes: Nothing significant to report at this time.

## 2024-04-09 ENCOUNTER — Other Ambulatory Visit: Payer: Self-pay | Admitting: Family Medicine

## 2024-04-09 DIAGNOSIS — E039 Hypothyroidism, unspecified: Secondary | ICD-10-CM

## 2024-05-26 DIAGNOSIS — L299 Pruritus, unspecified: Secondary | ICD-10-CM | POA: Diagnosis not present

## 2024-05-26 DIAGNOSIS — H6063 Unspecified chronic otitis externa, bilateral: Secondary | ICD-10-CM | POA: Diagnosis not present

## 2024-09-04 ENCOUNTER — Other Ambulatory Visit: Payer: Self-pay | Admitting: Family Medicine

## 2024-09-04 DIAGNOSIS — Z1231 Encounter for screening mammogram for malignant neoplasm of breast: Secondary | ICD-10-CM

## 2024-09-15 ENCOUNTER — Ambulatory Visit
Admission: RE | Admit: 2024-09-15 | Discharge: 2024-09-15 | Disposition: A | Discharge status: Home / Self Care | Source: Ambulatory Visit | Attending: Family Medicine | Admitting: Family Medicine

## 2024-09-15 DIAGNOSIS — Z1231 Encounter for screening mammogram for malignant neoplasm of breast: Secondary | ICD-10-CM

## 2024-10-02 ENCOUNTER — Other Ambulatory Visit: Payer: Self-pay

## 2024-10-02 DIAGNOSIS — E039 Hypothyroidism, unspecified: Secondary | ICD-10-CM

## 2025-01-07 ENCOUNTER — Ambulatory Visit
